# Patient Record
Sex: Female | Born: 1935 | Race: Black or African American | Hispanic: No | Smoking: Former smoker
Health system: Southern US, Community
[De-identification: ages and names within clinical notes are randomized; demographics above are authoritative.]

## PROBLEM LIST (undated history)

## (undated) DIAGNOSIS — N289 Disorder of kidney and ureter, unspecified: Secondary | ICD-10-CM

## (undated) DIAGNOSIS — F0390 Unspecified dementia without behavioral disturbance: Secondary | ICD-10-CM

## (undated) DIAGNOSIS — I480 Paroxysmal atrial fibrillation: Secondary | ICD-10-CM

## (undated) DIAGNOSIS — I219 Acute myocardial infarction, unspecified: Secondary | ICD-10-CM

## (undated) DIAGNOSIS — E785 Hyperlipidemia, unspecified: Secondary | ICD-10-CM

## (undated) DIAGNOSIS — I2510 Atherosclerotic heart disease of native coronary artery without angina pectoris: Secondary | ICD-10-CM

## (undated) DIAGNOSIS — I10 Essential (primary) hypertension: Secondary | ICD-10-CM

## (undated) DIAGNOSIS — K3184 Gastroparesis: Secondary | ICD-10-CM

## (undated) DIAGNOSIS — K219 Gastro-esophageal reflux disease without esophagitis: Secondary | ICD-10-CM

## (undated) DIAGNOSIS — Z950 Presence of cardiac pacemaker: Secondary | ICD-10-CM

## (undated) DIAGNOSIS — E039 Hypothyroidism, unspecified: Secondary | ICD-10-CM

## (undated) HISTORY — DX: Gastroparesis: K31.84

## (undated) HISTORY — PX: PACEMAKER INSERTION: SHX728

## (undated) HISTORY — DX: Paroxysmal atrial fibrillation: I48.0

## (undated) HISTORY — PX: APPENDECTOMY: SHX54

## (undated) HISTORY — DX: Unspecified dementia, unspecified severity, without behavioral disturbance, psychotic disturbance, mood disturbance, and anxiety: F03.90

## (undated) HISTORY — DX: Hypothyroidism, unspecified: E03.9

## (undated) HISTORY — DX: Presence of cardiac pacemaker: Z95.0

## (undated) HISTORY — DX: Essential (primary) hypertension: I10

## (undated) HISTORY — DX: Hyperlipidemia, unspecified: E78.5

## (undated) HISTORY — DX: Gastro-esophageal reflux disease without esophagitis: K21.9

---

## 2001-05-12 ENCOUNTER — Inpatient Hospital Stay (HOSPITAL_COMMUNITY): Admission: AD | Admit: 2001-05-12 | Discharge: 2001-05-16 | Payer: Self-pay | Admitting: Cardiology

## 2001-05-12 ENCOUNTER — Encounter: Payer: Self-pay | Admitting: Emergency Medicine

## 2001-05-29 ENCOUNTER — Emergency Department (HOSPITAL_COMMUNITY): Admission: EM | Admit: 2001-05-29 | Discharge: 2001-05-29 | Payer: Self-pay | Admitting: Emergency Medicine

## 2001-12-17 HISTORY — PX: HIP FRACTURE SURGERY: SHX118

## 2002-01-31 ENCOUNTER — Encounter: Payer: Self-pay | Admitting: Emergency Medicine

## 2002-01-31 ENCOUNTER — Emergency Department (HOSPITAL_COMMUNITY): Admission: EM | Admit: 2002-01-31 | Discharge: 2002-02-01 | Payer: Self-pay | Admitting: Emergency Medicine

## 2002-02-01 ENCOUNTER — Inpatient Hospital Stay (HOSPITAL_COMMUNITY): Admission: EM | Admit: 2002-02-01 | Discharge: 2002-02-04 | Payer: Self-pay | Admitting: Internal Medicine

## 2002-02-02 ENCOUNTER — Encounter: Payer: Self-pay | Admitting: Cardiovascular Disease

## 2002-04-22 ENCOUNTER — Encounter: Payer: Self-pay | Admitting: Emergency Medicine

## 2002-04-22 ENCOUNTER — Inpatient Hospital Stay (HOSPITAL_COMMUNITY): Admission: EM | Admit: 2002-04-22 | Discharge: 2002-04-30 | Payer: Self-pay | Admitting: Emergency Medicine

## 2002-04-25 ENCOUNTER — Encounter: Payer: Self-pay | Admitting: Internal Medicine

## 2002-04-26 ENCOUNTER — Encounter: Payer: Self-pay | Admitting: Orthopaedic Surgery

## 2002-04-30 ENCOUNTER — Inpatient Hospital Stay: Admission: AD | Admit: 2002-04-30 | Discharge: 2002-05-15 | Payer: Self-pay | Admitting: Family Medicine

## 2002-05-12 ENCOUNTER — Encounter: Payer: Self-pay | Admitting: Family Medicine

## 2002-05-12 ENCOUNTER — Ambulatory Visit (HOSPITAL_COMMUNITY): Admission: RE | Admit: 2002-05-12 | Discharge: 2002-05-12 | Payer: Self-pay | Admitting: Family Medicine

## 2002-11-30 ENCOUNTER — Encounter: Payer: Self-pay | Admitting: Emergency Medicine

## 2002-11-30 ENCOUNTER — Inpatient Hospital Stay (HOSPITAL_COMMUNITY): Admission: EM | Admit: 2002-11-30 | Discharge: 2002-12-11 | Payer: Self-pay | Admitting: Emergency Medicine

## 2002-12-01 ENCOUNTER — Encounter: Payer: Self-pay | Admitting: Cardiovascular Disease

## 2003-02-10 ENCOUNTER — Ambulatory Visit (HOSPITAL_COMMUNITY): Admission: RE | Admit: 2003-02-10 | Discharge: 2003-02-10 | Payer: Self-pay | Admitting: Family Medicine

## 2003-02-10 ENCOUNTER — Encounter: Payer: Self-pay | Admitting: Family Medicine

## 2004-08-10 ENCOUNTER — Ambulatory Visit (HOSPITAL_COMMUNITY): Admission: RE | Admit: 2004-08-10 | Discharge: 2004-08-10 | Payer: Self-pay | Admitting: Family Medicine

## 2005-08-21 ENCOUNTER — Emergency Department (HOSPITAL_COMMUNITY): Admission: EM | Admit: 2005-08-21 | Discharge: 2005-08-21 | Payer: Self-pay | Admitting: Emergency Medicine

## 2005-12-17 HISTORY — PX: HERNIA REPAIR: SHX51

## 2006-06-17 ENCOUNTER — Ambulatory Visit (HOSPITAL_COMMUNITY): Admission: RE | Admit: 2006-06-17 | Discharge: 2006-06-17 | Payer: Self-pay | Admitting: Family Medicine

## 2006-07-29 ENCOUNTER — Inpatient Hospital Stay (HOSPITAL_COMMUNITY): Admission: EM | Admit: 2006-07-29 | Discharge: 2006-08-02 | Payer: Self-pay | Admitting: Emergency Medicine

## 2006-07-29 ENCOUNTER — Encounter (INDEPENDENT_AMBULATORY_CARE_PROVIDER_SITE_OTHER): Payer: Self-pay | Admitting: *Deleted

## 2008-11-24 ENCOUNTER — Emergency Department (HOSPITAL_COMMUNITY): Admission: EM | Admit: 2008-11-24 | Discharge: 2008-11-24 | Payer: Self-pay | Admitting: Emergency Medicine

## 2009-08-30 ENCOUNTER — Inpatient Hospital Stay (HOSPITAL_COMMUNITY): Admission: EM | Admit: 2009-08-30 | Discharge: 2009-09-02 | Payer: Self-pay | Admitting: Emergency Medicine

## 2009-09-08 ENCOUNTER — Emergency Department (HOSPITAL_COMMUNITY): Admission: EM | Admit: 2009-09-08 | Discharge: 2009-09-08 | Payer: Self-pay | Admitting: Emergency Medicine

## 2009-09-09 ENCOUNTER — Inpatient Hospital Stay (HOSPITAL_COMMUNITY): Admission: EM | Admit: 2009-09-09 | Discharge: 2009-09-14 | Payer: Self-pay | Admitting: Emergency Medicine

## 2011-01-06 ENCOUNTER — Encounter: Payer: Self-pay | Admitting: Family Medicine

## 2011-01-07 ENCOUNTER — Encounter: Payer: Self-pay | Admitting: Family Medicine

## 2011-02-13 ENCOUNTER — Emergency Department (HOSPITAL_COMMUNITY): Payer: Medicare Other

## 2011-02-13 ENCOUNTER — Inpatient Hospital Stay (HOSPITAL_COMMUNITY)
Admission: EM | Admit: 2011-02-13 | Discharge: 2011-02-16 | Disposition: A | Discharge status: Other Acute Inpatient Hospital | Payer: Medicare Other | Source: Home / Self Care | Attending: Family Medicine | Admitting: Family Medicine

## 2011-02-13 DIAGNOSIS — I2789 Other specified pulmonary heart diseases: Secondary | ICD-10-CM | POA: Diagnosis present

## 2011-02-13 DIAGNOSIS — I453 Trifascicular block: Secondary | ICD-10-CM | POA: Diagnosis present

## 2011-02-13 DIAGNOSIS — I214 Non-ST elevation (NSTEMI) myocardial infarction: Secondary | ICD-10-CM | POA: Diagnosis present

## 2011-02-13 DIAGNOSIS — F0390 Unspecified dementia without behavioral disturbance: Secondary | ICD-10-CM | POA: Diagnosis present

## 2011-02-13 DIAGNOSIS — I442 Atrioventricular block, complete: Secondary | ICD-10-CM | POA: Diagnosis present

## 2011-02-13 DIAGNOSIS — I10 Essential (primary) hypertension: Secondary | ICD-10-CM | POA: Diagnosis present

## 2011-02-13 DIAGNOSIS — J9819 Other pulmonary collapse: Secondary | ICD-10-CM | POA: Diagnosis present

## 2011-02-13 DIAGNOSIS — E119 Type 2 diabetes mellitus without complications: Secondary | ICD-10-CM | POA: Diagnosis present

## 2011-02-13 DIAGNOSIS — T82190A Other mechanical complication of cardiac electrode, initial encounter: Secondary | ICD-10-CM | POA: Diagnosis present

## 2011-02-13 DIAGNOSIS — D509 Iron deficiency anemia, unspecified: Secondary | ICD-10-CM | POA: Diagnosis present

## 2011-02-13 DIAGNOSIS — J189 Pneumonia, unspecified organism: Principal | ICD-10-CM | POA: Diagnosis present

## 2011-02-13 DIAGNOSIS — Z96649 Presence of unspecified artificial hip joint: Secondary | ICD-10-CM

## 2011-02-13 DIAGNOSIS — Z45018 Encounter for adjustment and management of other part of cardiac pacemaker: Secondary | ICD-10-CM

## 2011-02-13 DIAGNOSIS — J90 Pleural effusion, not elsewhere classified: Secondary | ICD-10-CM | POA: Diagnosis present

## 2011-02-13 DIAGNOSIS — Z950 Presence of cardiac pacemaker: Secondary | ICD-10-CM

## 2011-02-13 DIAGNOSIS — Y831 Surgical operation with implant of artificial internal device as the cause of abnormal reaction of the patient, or of later complication, without mention of misadventure at the time of the procedure: Secondary | ICD-10-CM | POA: Diagnosis present

## 2011-02-13 DIAGNOSIS — Z794 Long term (current) use of insulin: Secondary | ICD-10-CM

## 2011-02-13 DIAGNOSIS — I441 Atrioventricular block, second degree: Secondary | ICD-10-CM | POA: Diagnosis present

## 2011-02-13 DIAGNOSIS — E039 Hypothyroidism, unspecified: Secondary | ICD-10-CM | POA: Diagnosis present

## 2011-02-13 DIAGNOSIS — R918 Other nonspecific abnormal finding of lung field: Secondary | ICD-10-CM | POA: Diagnosis present

## 2011-02-13 LAB — CK TOTAL AND CKMB (NOT AT ARMC)
CK, MB: 7.2 ng/mL (ref 0.3–4.0)
Relative Index: 2.7 — ABNORMAL HIGH (ref 0.0–2.5)
Total CK: 263 U/L — ABNORMAL HIGH (ref 7–177)

## 2011-02-13 LAB — CBC
Hemoglobin: 10.5 g/dL — ABNORMAL LOW (ref 12.0–15.0)
RDW: 14.6 % (ref 11.5–15.5)
WBC: 5.5 10*3/uL (ref 4.0–10.5)

## 2011-02-13 LAB — COMPREHENSIVE METABOLIC PANEL
AST: 45 U/L — ABNORMAL HIGH (ref 0–37)
Alkaline Phosphatase: 82 U/L (ref 39–117)
BUN: 19 mg/dL (ref 6–23)
CO2: 23 mEq/L (ref 19–32)
Calcium: 8.8 mg/dL (ref 8.4–10.5)
GFR calc Af Amer: >60 mL/min (ref >60)
Glucose, Bld: 97 mg/dL (ref 70–99)
Potassium: 4.7 mEq/L (ref 3.5–5.1)
Sodium: 129 mEq/L — ABNORMAL LOW (ref 135–145)

## 2011-02-13 LAB — URINALYSIS, ROUTINE W REFLEX MICROSCOPIC
Bilirubin Urine: NEGATIVE
Ketones, ur: NEGATIVE mg/dL
Leukocytes, UA: NEGATIVE

## 2011-02-13 LAB — SALICYLATE LEVEL: Salicylate Lvl: <4 mg/dL (ref 2.8–20.0)

## 2011-02-13 LAB — LIPASE, BLOOD: Lipase: 20 U/L (ref 11–59)

## 2011-02-13 LAB — DIFFERENTIAL
Basophils Absolute: 0 10*3/uL (ref 0.0–0.1)
Basophils Relative: 0 % (ref 0–1)
Lymphs Abs: 0.7 10*3/uL (ref 0.7–4.0)
Monocytes Relative: 5 % (ref 3–12)

## 2011-02-13 LAB — TROPONIN I: Troponin I: 0.27 ng/mL — ABNORMAL HIGH (ref 0.00–0.06)

## 2011-02-13 LAB — URINE MICROSCOPIC-ADD ON

## 2011-02-14 LAB — CARDIAC PANEL(CRET KIN+CKTOT+MB+TROPI)
Relative Index: 2.7 — ABNORMAL HIGH (ref 0.0–2.5)
Total CK: 254 U/L — ABNORMAL HIGH (ref 7–177)
Troponin I: 0.39 ng/mL — ABNORMAL HIGH (ref 0.00–0.06)
Troponin I: 0.3 ng/mL — ABNORMAL HIGH (ref 0.00–0.06)

## 2011-02-14 LAB — GLUCOSE, CAPILLARY
Glucose-Capillary: 123 mg/dL — ABNORMAL HIGH (ref 70–99)
Glucose-Capillary: 218 mg/dL — ABNORMAL HIGH (ref 70–99)

## 2011-02-15 ENCOUNTER — Inpatient Hospital Stay (HOSPITAL_COMMUNITY): Payer: Medicare Other

## 2011-02-15 DIAGNOSIS — I369 Nonrheumatic tricuspid valve disorder, unspecified: Secondary | ICD-10-CM

## 2011-02-15 DIAGNOSIS — R7989 Other specified abnormal findings of blood chemistry: Secondary | ICD-10-CM

## 2011-02-15 DIAGNOSIS — I442 Atrioventricular block, complete: Secondary | ICD-10-CM

## 2011-02-15 LAB — CARDIAC PANEL(CRET KIN+CKTOT+MB+TROPI)
CK, MB: 4.9 ng/mL — ABNORMAL HIGH (ref 0.3–4.0)
Relative Index: 2.1 (ref 0.0–2.5)
Relative Index: 2.3 (ref 0.0–2.5)
Total CK: 267 U/L — ABNORMAL HIGH (ref 7–177)
Troponin I: 0.37 ng/mL — ABNORMAL HIGH (ref 0.00–0.06)
Troponin I: 0.3 ng/mL — ABNORMAL HIGH (ref 0.00–0.06)

## 2011-02-15 LAB — GLUCOSE, CAPILLARY
Glucose-Capillary: 200 mg/dL — ABNORMAL HIGH (ref 70–99)
Glucose-Capillary: 196 mg/dL — ABNORMAL HIGH (ref 70–99)
Glucose-Capillary: 192 mg/dL — ABNORMAL HIGH (ref 70–99)

## 2011-02-15 LAB — BASIC METABOLIC PANEL
BUN: 17 mg/dL (ref 6–23)
Chloride: 98 mEq/L (ref 96–112)
Creatinine, Ser: 1.28 mg/dL — ABNORMAL HIGH (ref 0.4–1.2)
GFR calc Af Amer: 48 mL/min — ABNORMAL LOW (ref >60)
GFR calc non Af Amer: 40 mL/min — ABNORMAL LOW (ref >60)
Potassium: 4.4 mEq/L (ref 3.5–5.1)

## 2011-02-15 NOTE — Consult Note (Signed)
[name], [name]            ACCOUNT NO.:  [id number]  MEDICAL RECORD NO.:  [id number]           PATIENT TYPE:  I  LOCATION:  IC03                          FACILITY:  [facility]  PHYSICIAN:  Jonelle Sidle, MD DATE OF BIRTH:  Dec 14, 1925  DATE OF CONSULTATION: DATE OF DISCHARGE:                                CONSULTATION   ADDENDUM:  REQUESTING PHYSICIAN:  Melvyn Novas, MD  PRIOR CARDIOLOGIST: 1. Thomas C. Wall, MD, FACC 2. Bruce Elvera Lennox Juanda Chance, MD, [facility]  REASON FOR CONSULTATION:  Bradycardia.  Please refer to the full dictated cardiology consultation by Ms. Joni Reining, NP.  In brief summary, Ms. 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 Juanda Chance in December 2003.  Details of her cardiology and device followup are not clear to me at this point based on record review.  She presently resides in an assisted living facility and was admitted to the hospital with symptoms of cough, chest congestion, possibly also some chest pain.  She is being managed for possible pneumonia with antibiotic therapy.  Review of her laboratory data finds abnormal cardiac markers with troponin-I levels hovering around 0.30, and CK-MB levels peaking at 7.2, trending down from there. ECG shows nonspecific ST-T changes with left anterior fascicular block, right bundle-branch block, and evidence of intermittent second-degree type 1 and type 2 heart block, most importantly the absence of any pacer spikes.  I became involved in her care after reading a 2-D echocardiogram earlier today that demonstrated evidence of severe left ventricular hypertrophy, likely a hypertrophic cardiomyopathy, with LVEF of 65% to 70%, moderate mitral regurgitation, moderately dilated  left atrium, and moderate tricuspid regurgitation with evidence of severe pulmonary hypertension, pulmonary artery systolic pressure of approximately at 70 mmHg.  She was noted on that particular study to have evidence of heart block that appeared to be intermittently high- grade with heart rates in the 30s to 40s, despite the presence of a pacemaker wire visualized within the right heart.  I called Dr. Janna Arch about these findings, and we arranged to have the patient's device interrogated, finding that the battery is completely discharged.  At this point, no family is available for further discussion about the patient.  She apparently has a healthcare power of attorney locally, and a sister that has not been able to be reached.  She is hemodynamically stable with heart rate typically in the 40s.  In speaking with her, she is alert and oriented x1, not complaining of active chest pain, and with interaction, her heart rate comes up into the 40s and 50s, sinus rhythm with a very prolonged PR interval noted.  She continues to have intermittent second-degree heart block, and I suspect higher degree of heart block at times.  At presentation, she was on beta- blocker therapy, specifically Lopressor at 50 mg p.o. b.i.d.  This has recently been stopped.  PHYSICAL EXAMINATION:  VITAL SIGNS:  On my examination, temperature is 98.2 degrees, heart rate in the 40s,  dipping down into the upper 30s intermittently, respirations 22, oxygen saturation is 93% on room air, blood pressure 126/64, weight is 83 kg. GENERAL:  This is an obese woman in no acute distress. HEENT:  Conjunctiva and lids are normal.  Oropharynx with moist mucosa. NECK:  Supple.  Increased girth.  No elevated JVP.  No thyromegaly. LUNGS:  Exhibit diminished coarse breath sounds. CARDIAC:  Exam reveals an S4 with soft systolic murmur.  No S3, gallop, or pericardial rub.  Examination of the thorax finds a pacemaker pocket site with  device on the right. ABDOMEN:  Obese, nontender.  Bowel sounds present. EXTREMITIES:  Exhibit no significant pitting edema.  Distal pulses are 1- 2+. SKIN:  Warm and dry. MUSCULOSKELETAL:  No kyphosis is noted. NEUROPSYCHIATRIC:  The patient is alert and oriented x1 at this time. She is calm with appropriate affect.  LABORATORY DATA:  WBC is 5.5, hemoglobin 10.5, platelets 251.  Sodium 135, potassium 4.4, chloride 98, bicarb 26, glucose 161, BUN 17, creatinine 1.2.  Peak CK is 267, peak CK-MB initially was 7.2 now down to 4.9, and troponin-I has been relatively flat around 0.30.  DIAGNOSTIC STUDIES:  Most recent chest x-ray from February 13, 2011 reported low lung volumes with vascular crowning, bibasilar atelectasis, also stable cardiac enlargement.  REVIEW OF SYSTEMS:  Somewhat difficult given the patient's baseline mental status and reported dementia.  She does state that she has trouble "choking" on some foods, question of aspiration events.  Her chest pain episodes are difficult to sort out, somewhat vague in description.  She has not had any obvious syncope.  IMPRESSION: 1. Trifascicular heart block with second-degree type 1 and type 2 AV     block, likely episodes of higher degree heart block as well, with     findings of a completely discharged pacemaker battery.  Based on     record review, she had a Medtronic Kappa device placement Dr.     Juanda Chance in December 2003.  Details of followup are not clear to me     at this time.  She is hemodynamically stable in terms of blood     pressure and has had beta-blocker therapy interrupted. 2. Abnormal cardiac markers, technically a probable type 2 non-ST-     elevation myocardial infarction, recent chest pain somewhat vague     and difficult to sort out.  ECG does not show acute ST-segment     changes over nonspecific abnormalities, which have included some     inferior T-wave inversions on serial tracings.  There is no clear      history of obstructive coronary artery disease based on limited     information, which includes a normal Myoview from 2003. 3. Probable hypertrophic or hypertensive cardiomyopathy with severe     left ventricular hypertrophy and LVEF recently documented at 65% to     70% associated with diastolic dysfunction. 4. Severe pulmonary hypertension with pulmonary artery systolic     pressure of 70 mmHg, associated with moderate mitral and tricuspid     regurgitation. 5. Reported dementia. 6. Type 2 diabetes mellitus. 7. Hypertension. 8. Hyperlipidemia. 9. Possible pneumonia, question additionally the possibility of     intermittent aspiration.  RECOMMENDATIONS:  Agree with discontinuing beta-blocker.  We can certainly consider having the patient transferred to [facility] for evaluation by our Electrophysiology Team, specifically for battery replacement in the patient's Medtronic device.  At this point, she is hemodynamically stable, and attempts are being made by  nursing staff to locate family and/or power of attorney to discuss this issue further. Once appropriate contacts were made, our service can be updated and we can assist with plans as needed.  In terms of her cardiac markers, this will need to be followed, although at this point, I do not anticipate aggressive ischemic evaluation, and the troponin levels may in fact be more consistent with her hypertrophic myopathy with increased left ventricular strain in the setting of bradycardia and hypoertension, as well as her pulmonary hypertension.  For now, she is on aspirin and Lovenox, continuing on Lotensin and Norvasc for blood pressure control.  Our service will follow with you.     Jonelle Sidle, MD     SGM/MEDQ  D:  02/15/2011  T:  02/15/2011  Job:  [id number]  Electronically Signed by Nona Dell MD on 02/15/2011 09:34:46 PM

## 2011-02-16 ENCOUNTER — Inpatient Hospital Stay (HOSPITAL_COMMUNITY)
Admission: AD | Admit: 2011-02-16 | Discharge: 2011-02-19 | DRG: 982 | Disposition: A | Discharge status: Home Health | Payer: Medicare Other | Source: Other Acute Inpatient Hospital | Attending: Internal Medicine | Admitting: Internal Medicine

## 2011-02-16 DIAGNOSIS — I5031 Acute diastolic (congestive) heart failure: Secondary | ICD-10-CM

## 2011-02-16 DIAGNOSIS — I441 Atrioventricular block, second degree: Secondary | ICD-10-CM

## 2011-02-16 LAB — CBC
HCT: 32.2 % — ABNORMAL LOW (ref 36.0–46.0)
MCH: 29.8 pg (ref 26.0–34.0)
MCV: 90.4 fL (ref 78.0–100.0)
Platelets: 241 10*3/uL (ref 150–400)
RDW: 14.4 % (ref 11.5–15.5)

## 2011-02-16 LAB — BASIC METABOLIC PANEL
BUN: 14 mg/dL (ref 6–23)
Creatinine, Ser: 1.18 mg/dL (ref 0.4–1.2)
GFR calc non Af Amer: 44 mL/min — ABNORMAL LOW (ref >60)
Glucose, Bld: 239 mg/dL — ABNORMAL HIGH (ref 70–99)

## 2011-02-16 LAB — GLUCOSE, CAPILLARY
Glucose-Capillary: 203 mg/dL — ABNORMAL HIGH (ref 70–99)
Glucose-Capillary: 214 mg/dL — ABNORMAL HIGH (ref 70–99)
Glucose-Capillary: 123 mg/dL — ABNORMAL HIGH (ref 70–99)

## 2011-02-16 LAB — DIFFERENTIAL
Eosinophils Absolute: 0.2 10*3/uL (ref 0.0–0.7)
Eosinophils Relative: 5 % (ref 0–5)
Lymphocytes Relative: 34 % (ref 12–46)
Neutro Abs: 2 10*3/uL (ref 1.7–7.7)
Neutrophils Relative %: 51 % (ref 43–77)

## 2011-02-16 LAB — IRON AND TIBC
Saturation Ratios: 17 % — ABNORMAL LOW (ref 20–55)
UIBC: 231 ug/dL

## 2011-02-16 LAB — BRAIN NATRIURETIC PEPTIDE: Pro B Natriuretic peptide (BNP): 611 pg/mL — ABNORMAL HIGH (ref 0.0–100.0)

## 2011-02-16 LAB — CARDIAC PANEL(CRET KIN+CKTOT+MB+TROPI): Troponin I: 0.19 ng/mL — ABNORMAL HIGH (ref 0.00–0.06)

## 2011-02-17 ENCOUNTER — Inpatient Hospital Stay (HOSPITAL_COMMUNITY): Payer: Medicare Other

## 2011-02-17 LAB — BASIC METABOLIC PANEL
CO2: 27 mEq/L (ref 19–32)
Calcium: 8.8 mg/dL (ref 8.4–10.5)
Creatinine, Ser: 1.05 mg/dL (ref 0.4–1.2)
GFR calc Af Amer: >60 mL/min (ref >60)
GFR calc non Af Amer: 50 mL/min — ABNORMAL LOW (ref >60)
Sodium: 135 mEq/L (ref 135–145)

## 2011-02-17 LAB — BLOOD GAS, ARTERIAL
Acid-Base Excess: 5.7 mmol/L — ABNORMAL HIGH (ref 0.0–2.0)
Bicarbonate: 29.6 mEq/L — ABNORMAL HIGH (ref 20.0–24.0)
Drawn by: 213381
FIO2: 0.21 %
O2 Saturation: 87 %
Patient temperature: 98.6
TCO2: 30.9 mmol/L (ref 0–100)
pCO2 arterial: 42.3 mmHg (ref 35.0–45.0)
pH, Arterial: 7.46 — ABNORMAL HIGH (ref 7.350–7.400)
pO2, Arterial: 52 mmHg — ABNORMAL LOW (ref 80.0–100.0)

## 2011-02-17 LAB — AMMONIA: Ammonia: 37 umol/L — ABNORMAL HIGH (ref 11–35)

## 2011-02-17 LAB — CBC
Hemoglobin: 11 g/dL — ABNORMAL LOW (ref 12.0–15.0)
MCH: 29.9 pg (ref 26.0–34.0)
Platelets: 273 10*3/uL (ref 150–400)
RBC: 3.68 MIL/uL — ABNORMAL LOW (ref 3.87–5.11)
WBC: 4.1 10*3/uL (ref 4.0–10.5)

## 2011-02-17 LAB — GLUCOSE, CAPILLARY
Glucose-Capillary: 228 mg/dL — ABNORMAL HIGH (ref 70–99)
Glucose-Capillary: 287 mg/dL — ABNORMAL HIGH (ref 70–99)
Glucose-Capillary: 268 mg/dL — ABNORMAL HIGH (ref 70–99)

## 2011-02-17 LAB — APTT: aPTT: 30 seconds (ref 24–37)

## 2011-02-18 LAB — BASIC METABOLIC PANEL
GFR calc non Af Amer: 41 mL/min — ABNORMAL LOW (ref >60)
Glucose, Bld: 185 mg/dL — ABNORMAL HIGH (ref 70–99)
Potassium: 4.1 mEq/L (ref 3.5–5.1)
Sodium: 134 mEq/L — ABNORMAL LOW (ref 135–145)

## 2011-02-18 LAB — GLUCOSE, CAPILLARY
Glucose-Capillary: 279 mg/dL — ABNORMAL HIGH (ref 70–99)
Glucose-Capillary: 248 mg/dL — ABNORMAL HIGH (ref 70–99)

## 2011-02-19 LAB — BASIC METABOLIC PANEL WITH GFR
BUN: 11 mg/dL (ref 6–23)
CO2: 29 meq/L (ref 19–32)
Calcium: 8.7 mg/dL (ref 8.4–10.5)
Chloride: 96 meq/L (ref 96–112)
Creatinine, Ser: 1.14 mg/dL (ref 0.4–1.2)
GFR calc non Af Amer: 46 mL/min — ABNORMAL LOW
Glucose, Bld: 229 mg/dL — ABNORMAL HIGH (ref 70–99)
Potassium: 3.9 meq/L (ref 3.5–5.1)
Sodium: 132 meq/L — ABNORMAL LOW (ref 135–145)

## 2011-02-19 LAB — GLUCOSE, CAPILLARY
Glucose-Capillary: 222 mg/dL — ABNORMAL HIGH (ref 70–99)
Glucose-Capillary: 254 mg/dL — ABNORMAL HIGH (ref 70–99)

## 2011-02-19 NOTE — Consult Note (Signed)
[name], [name]            ACCOUNT NO.:  [id number]  MEDICAL RECORD NO.:  [id number]           PATIENT TYPE:  I  LOCATION:  IC03                          FACILITY:  [facility]  PHYSICIAN:  Jonelle Sidle, MD DATE OF BIRTH:  08-12-1925  DATE OF CONSULTATION:  02/15/2011 DATE OF DISCHARGE:                                CONSULTATION   PRIMARY CARDIOLOGIST:  Dr. Doylene Canning. Ladona Ridgel.  PRIMARY CARE PHYSICIAN:  Dr. Janna Arch.  REASON FOR CONSULTATION:  Complete heart block with positive cardiac enzymes.  HISTORY OF PRESENT ILLNESS:  This is an 77 year old African American female with known history of bifascicular block, status post pacemaker in 2003 per Dr. Ladona Ridgel with a known history of hypertension as well and diabetes and hyperlipidemia.  The patient was admitted initially with cough and congestion, had an abnormal chest x-ray demonstrating infiltrates.  She was also noted to have positive cardiac enzymes onadmission.  Echocardiogram was ordered by Dr. Janna Arch and which was found to have incidentally that she was in complete heart block with intermittent second-degree heart block type 1 and 2.  Dr. Diona Browner did call Dr. Janna Arch to notify him of this and we were asked to some consult as we have seen the patient in the remote past for pacemaker implantation.  The pacemaker was interrogated per Medtronic and it was found that the battery was totally depleted and no interrogation with possible.  The patient is currently very stable.  She is responsive. She has some mild dementia and therefore is partially confused, but she denies any symptoms.  We have tried to call family members to question whether they would wishes [place] to proceed with replacement of pacemaker battery versus allowing her to continue as is.  After several attempt, we have not been unable to contact them.  REVIEW OF SYSTEMS:  Positive for cough and dysphasia.  She states that her food sticks going down and she  often cough and gets them caught in her throat.  All other systems have been reviewed and found to be negative.  Code status is full.  Please note that the patient is a poor historian secondary to dementia.  PAST MEDICAL HISTORY:  Insulin-dependent diabetes, hypertension, hyperlipidemia, dual-chamber pacemaker, since then Medtronic cath secondary to tri-fascicular block.  This is a dual chamber placed in 2003 per Dr. Ladona Ridgel.  She also has a history of paroxysmal atrial fibrillation, GERD, and dementia.  The patient did have an ischemic workup revealing a Cardiolite in 2002 showing no ischemia.  Echocardiogram dated February 15, 2011 revealing left ventricular cavity size was reduced with wall thickness increase with severe LVH, systolic function with vigorous with a range of 65% EF.  She also had elevated end diastolic filling pressures.  The patient had mild to moderately calcified aortic valve, mitral valve had calcified annulus with mildly thickened leaflets and moderate regurgitation.  Pulmonary artery pressure was severely increase with an estimated pressure of 70 mmHg.  PAST SURGICAL HISTORY:  Left hip replacement also incarcerated hernia repair and dual-chamber pacemaker placement.  SOCIAL HISTORY:  She lives in [place] [facility]. She is retired from [employer].  She is widowed.  She has family members, a sister but we are unable to contact her.  She stopped smoking several years ago.  Negative EtOH or drug use.  FAMILY HISTORY:  Mother deceased from an MI in her 70s.  No other family history is available.  CURRENT MEDICATIONS:  Prior to admission: 1. Norvasc 10 mg daily. 2. Insulin 70/30, 12 units q.a.m. 3. Lopressor 50 mg b.i.d. 4. Benazepril 10 mg daily. 5. Synthroid 50 mg daily. 6. Allopurinol 20 mg daily. 7. Prilosec 20 mg daily.  ALLERGIES:  No known drug allergies.  CURRENT LABORATORY DATA:  Sodium 135, potassium 4.4, chloride 98, CO2  of 26, BUN 17, creatinine 1.2, glucose 161.  Hemoglobin 10.5, hematocrit 31.3, white blood cells 5.5, platelets 251.  Troponin 0.27, 0.39, 0.30, and 0.37 respectively with MBs between 5.2 and 5.9 respectively.  MRSA is negative.  RADIOLOGY:  Chest x-ray low lung volumes with vascular crowding bibasilar atelectasis.  EKG revealing third-degree AV block with intermittent Wenckebach with rates in the 30s to 40s.  PHYSICAL EXAMINATION:  VITAL SIGNS:  Blood pressure 156/108, pulse 30, respirations 22, temperature 100% on 2 L. GENERAL:  She is awake and alert with some mild confusion although she is responsive.  She does know the year and she knows where she is. HEENT:  Normocephalic and atraumatic.  Left eye did not react to light and appears to be deviating to the right. NECK:  Supple without bruit or JVD noted. CARDIOVASCULAR:  Irregular rhythm, bradycardic with 2/6 systolic murmur at the apex.  Pulses are 2+ and equal. LUNGS:  Some coarse breath sounds without wheezes or rhonchi.  She does not cough with deep inspiration. ABDOMEN:  Soft, obese, nontender with 2+ bowel sounds. EXTREMITIES:  Without clubbing, cyanosis, or edema. MUSCULOSKELETAL:  She does have some mild kyphosis noted. NEURO:  Cranial nerves II through XII are essentially intact.  As stated above, she is mildly confused.  IMPRESSION: 1. Complete heart block with intermittent Wenckebach.  Pacemaker     battery is completed depleted, but heart rate does go up from 30s     to 60s with movement and stimulation pattern of third-degree to     second-degree variable heart block is noted.  Lopressor has been     discontinued and we agree with this.  We have external pacemaker     made available, should her heart rate be less than or equal to 29     beats per minute sustain symptomatically with atropine p.r.n.  We     will need definite decision concerning better replacement if her     family request.  She is certainly still  viable and this would     definitely make her feel better, should her heart rhythm and rate     be more normalized.  She is stable currently.  We will make further     recommendations once we have family's opinion and/or request     concerning need for replacement or to leave her alone. 2. Hypertension, currently is not well controlled.  We can increase     benazepril as needed, but now we will discontinue the clonidine as     this is not the best choice at this time secondary to bradycardia.     We will change to hydralazine 25 mg t.i.d. and leave room for     titration should this be necessary.  We would like to thank Dr. Janna Arch for allowing [place] to participate  in the care of this patient.  Please see Dr. Remi Deter Autry Droege's addendum to this H and P for more thought and recommendations.     Bettey Mare. Lyman Bishop, NP   ______________________________ Jonelle Sidle, MD    KML/MEDQ  D:  02/15/2011  T:  02/15/2011  Job:  [id number]  Electronically Signed by Joni Reining NP on 02/19/2011 11:14:40 AM Electronically Signed by Nona Dell MD on 02/19/2011 11:31:44 AM

## 2011-02-20 ENCOUNTER — Encounter: Payer: Self-pay | Admitting: Internal Medicine

## 2011-02-22 NOTE — H&P (Signed)
[name], [name]            ACCOUNT NO.:  [id number]  MEDICAL RECORD NO.:  [id number]           PATIENT TYPE:  I  LOCATION:  A341                          FACILITY:  [facility]  PHYSICIAN:  Melvyn Novas, MDDATE OF BIRTH:  [birth year]  DATE OF ADMISSION:  02/13/2011 DATE OF DISCHARGE:  LH                             HISTORY & PHYSICAL   The patient is an 77 year old demented black female in assisted living facility with history of insulin-dependent diabetes, hypertension, hyperlipidemia and hyperthyroidism.  Apparently, the patient came in last night with an infiltrate on chest x-ray, some cough, was placed on Rocephin and Zithromax.  She seems to be significantly hypertensive at present.  I am not sure if she was compliant with medicines as an outpatient.  Cardiac enzymes are essentially elevated with a total CK minimally elevated to 54 and MB elevated to 6.9 with an elevated index of 2.7 and troponin of 0.39.  She likewise has hyponatremia.  The patient denies any specific history of angina, orthopnea or PND.  She states she does have a cough, but no sputum production.  She is a poor historian.  The patient will be admitted for dual antibiotics, Rocephin and Zithromax as well as serial cardiac enzymes, 2-D echocardiogram, anticoagulation, antiplatelet therapy and beta blockade as well as control of antihypertensive with presumably her regular medicines to see her response.  PAST MEDICAL HISTORY:  Significant for insulin-dependent diabetes, hypertension, hypothyroidism, GERD and dementia.  PAST SURGICAL HISTORY:  Remarkable for dual-chamber pacer and appendectomy.  CURRENT MEDICATIONS: 1. Norvasc 10 mg per day. 2. Insulin 70/30 insulin 12 units subcu in a.m. only or a.c. b.i.d.     not certain. 3. Lopressor 50 mg p.o. b.i.d. 4. Benazepril 10 mg p.o. daily. 5. Synthroid 50 mcg per day. 6. Allopurinol 200 mg per day. 7. Prilosec 20 mg p.o. daily.  PHYSICAL  EXAMINATION:  VITAL SIGNS:  Blood pressure at present is 197/74, temperature is 98.1, pulse is 58 and regular, respiratory rate is 20, O2 sat is 91% on room air. EYES:  EYES:  PERRLA intact.  Sclerae clear.  Conjunctivae pink. NECK:  JVD.  No carotid bruits.  No thyromegaly or thyroid bruits. LUNGS:  Prolonged expiratory phase.  Diminished breath sounds at bases. No rales, wheeze or rhonchi appreciable. HEART:  Regular rhythm, 1/6 aortic outflow murmur.  No S3, S4, gallops. No heaves, thrills, or rubs. ABDOMEN:  Soft, nontender.  Bowel sounds are normoactive.  No guarding, rebound, or hepatosplenomegaly. EXTREMITIES:  Trace to 1+ pedal edema. NEUROLOGIC:  Cranial nerves II through XII gross intact.  The patient moves all four extremities.  The patient is mildly demented, oriented to person and place.  IMPRESSION: 1. Infiltrate per chest x-ray on dual antibiotics. 2. Elevated cardiac enzymes, consider ischemic event. 3. Hypertension. 4. Insulin-dependent diabetes. 5. Hyperlipidemia. 6. Dementia.  PLAN:  At present is aspirin 325 p.o. daily, continue Norvasc 10, add Lopressor 25 p.o. b.i.d. before meals and at bedtime glucoses, serial cardiac enzymes q.8 h x3, 2-D echocardiogram for wall motion analysis, Lovenox 40 per day subcu, and I will make further recommendations as the database expands.  Melvyn Novas, MD     RMD/MEDQ  D:  02/14/2011  T:  02/14/2011  Job:  [id number]  Electronically Signed by Oval Linsey MD on 02/22/2011 07:15:43 AM

## 2011-02-25 NOTE — Consult Note (Signed)
NAMEMAREN, WIESEN NO.:  [id number]  MEDICAL RECORD NO.:  [id number]           PATIENT TYPE:  I  LOCATION:  2032                         FACILITY:  [facility]  PHYSICIAN:  Thana Farr, MD    DATE OF BIRTH:  1925/03/16  DATE OF CONSULTATION:  02/18/2011 DATE OF DISCHARGE:                                CONSULTATION   CONSULT CALLED BY:  Hillis Range, MD  HISTORY OF PRESENT ILLNESS:  Ms. Gatling is an 77 year old female that was admitted due to her battery being fully discharged on her pacer. The patient was noted to be bradycardic in the 30s-40s.  The patient underwent a pacemaker generator change-out on February 16, 2011.  Since that time, per notes in the chart, the patient has been lethargic.  Consult called to determine etiology of lethargy.  The patient does have a history of dementia.  PAST MEDICAL HISTORY: 1. Diabetes. 2. Hypertension. 3. Hypothyroidism. 4. Dementia. 5. GE reflux disease.  SOCIAL HISTORY:  The patient lives in an assisted living facility. There is no current history of alcohol, tobacco, or illicit drug abuse.  MEDICATIONS:  Allopurinol, amlodipine, Lotensin, Fergon, Lasix, Apresoline. NovoLog, Levaquin, levothyroxine, Protonix, and Crestor.  PHYSICAL EXAMINATION:  Blood pressure 151/56, heart rate 73, respiratory rate 18, and T-max 99.0.  On mental status testing, the patient is alert.  She is oriented to name and place, but not to time.  She can follow simple commands without difficulty, but has more trouble with 3- step commands.  Speech is fluent.  On cranial nerve testing, II, visual fields are grossly intact.  III, IV, and VI, extraocular movements are intact.  V and VII, smile symmetric.  VII, grossly intact.  IX and X, to have positive gag.  XI, bilateral shoulder shrug.  XII, midline tongue extension.  On motor exam, the patient gives 5/5 strength throughout that is symmetric.  On sensory testing, pinprick and light touch  are intact bilaterally.  Deep tendon reflexes are 1+ in the upper extremities and absent in the lower extremities.  Plantars are downgoing on the left and upgoing on the right.  On cerebellar testing, finger-to- nose is intact bilaterally.  The patient is unable to perform heel-to- shin secondary to inability to follow commands.  LABORATORY DATA:  TSH is 3.04.  B12 of 893, ferritin 32.  Ammonia 37, calcium 8.8.  Sodium was 134, potassium 4.1, chloride 96, CO2 of 26, BUN creatinine 9 and 1.25 respectively, and glucose 185.  Hemoglobin and hematocrit 11.0 and 33.1 respectively, platelet count 273, and white blood cell count 4.1.  ASSESSMENT:  Ms. Dunsmore is an 77 year old female with a history of dementia that had some lethargy after her pacemaker generator was changed out.  She may have had a prolonged period of recuperation from this secondary to her baseline dementia.  She seems quite alert right now.  Her workup to date has been unremarkable.  We would only entertain further workup if she has some recurrent lethargy.  PLAN:  No further workup unless the patient has recurrent lethargy and at that time we would recommend head CT without contrast and EEG to  be performed.          ______________________________ Thana Farr, MD     LR/MEDQ  D:  02/18/2011  T:  02/19/2011  Job:  [id number]  Electronically Signed by Thana Farr MD on 02/25/2011 11:07:14 PM

## 2011-02-26 ENCOUNTER — Encounter (INDEPENDENT_AMBULATORY_CARE_PROVIDER_SITE_OTHER): Payer: Medicare Other

## 2011-02-26 ENCOUNTER — Encounter: Payer: Self-pay | Admitting: Internal Medicine

## 2011-02-26 DIAGNOSIS — I442 Atrioventricular block, complete: Secondary | ICD-10-CM

## 2011-02-27 NOTE — Miscellaneous (Signed)
  Clinical Lists Changes  Observations: Added new observation of PPM INDICATN: CHB (02/20/2011 7:21) Added new observation of MAGNET RTE: BOL 85 ERI 65 (02/20/2011 7:21) Added new observation of PPMLEADSTAT2: active (02/20/2011 7:21) Added new observation of PPMLEADSER2: [id number] (02/20/2011 7:21) Added new observation of PPMLEADMOD2: 5076  (02/20/2011 7:21) Added new observation of PPMLEADLOC2: RV  (02/20/2011 7:21) Added new observation of PPMLEADSTAT1: active  (02/20/2011 7:21) Added new observation of PPMLEADSER1: [id number]  (02/20/2011 7:21) Added new observation of PPMLEADMOD1: 5076  (02/20/2011 7:21) Added new observation of PPMLEADLOC1: RA  (02/20/2011 7:21) Added new observation of PPM IMP MD: Hillis Range, MD  (02/20/2011 7:21) Added new observation of PPMLEADDOI2: 02/16/2011  (02/20/2011 7:21) Added new observation of PPMLEADDOI1: 02/16/2011  (02/20/2011 7:21) Added new observation of PPM DOI: 02/16/2011  (02/20/2011 7:21) Added new observation of PPM SERL#: [id number]  (02/20/2011 7:21) Added new observation of PPM MODL#: [id number]  (02/20/2011 0:86) Added new observation of PACEMAKERMFG: Medtronic  (02/20/2011 7:21) Added new observation of PACEMAKER MD: Lewayne Bunting, MD  (02/20/2011 7:21)      PPM Specifications Following MD:  Lewayne Bunting, MD     PPM Vendor:  Medtronic     PPM Model Number:  [id number]     PPM Serial Number:  [id number] PPM DOI:  02/16/2011     PPM Implanting MD:  Hillis Range, MD  Lead 1    Location: RA     DOI: 02/16/2011     Model #: [id number]     Serial #: [id number]     Status: active Lead 2    Location: RV     DOI: 02/16/2011     Model #: [id number]     Serial #: [id number]     Status: active  Magnet Response Rate:  BOL 85 ERI 65  Indications:  CHB

## 2011-03-02 NOTE — Op Note (Signed)
NAMEMANDY, [name]            ACCOUNT NO.:  [id number]  MEDICAL RECORD NO.:  [id number]           PATIENT TYPE:  I  LOCATION:  2032                         FACILITY:  [facility]  PHYSICIAN:  Hillis Range, MD       DATE OF BIRTH:  [birth year]  DATE OF PROCEDURE: DATE OF DISCHARGE:                              OPERATIVE REPORT   EP PROCEDURE NOTE  SURGEON:  Hillis Range, MD  PREPROCEDURE DIAGNOSES: 1. Mobitz II second-degree AV block. 2. Previously implanted pacemaker at end of life.  POSTPROCEDURE DIAGNOSES: 1. Mobitz II second-degree AV block. 2. Previously implanted pacemaker at end of life.  PROCEDURES:  Pacemaker pulse generator replacement with pocket revision.  INTRODUCTION:  [name] is a pleasant 77 year old female who presents for urgent pacemaker pulse generator replacement.  She initially presented in 2003 with Mobitz II second-degree AV block for which she underwent implantation of a Medtronic pacemaker at that time.  The patient did well without Cardiology followup.  She was admitted recently to [facility] for presumed pneumonia.  She was found to have Mobitz II second-degree AV block with intermittent episodes of complete heart block.  Attempts to interrogate her pacemaker revealed that it was at end of life.  She therefore presents for urgent pacemaker pulse generator replacement.  DESCRIPTION OF PROCEDURE:  Informed written consent was obtained and the patient was brought to the Electrophysiology Lab in the fasting state. She did not receive sedation for the procedure today.  Her right chest was prepped and draped in the usual sterile fashion by the EP lab staff. The skin overlying her existing pacemaker was infiltrated with lidocaine for local analgesia.  A 4-cm incision was made over the existing pacemaker.  The pacemaker was exposed using a combination of sharp and blunt dissection.  Electrocautery was required to assure hemostasis. There was  no foreign matter or debris within the pocket.  The pacemaker was removed from the pocket and disconnected from the leads.  The right atrial lead was confirmed to be a Medtronic model 5076- 45 (serial number [id number]) lead implanted in December 08, 2002.  The right ventricular lead was confirmed to be a Medtronic model [id number] (serial number [id number]) lead implanted in December 08, 2002.  Both leads were examined and their integrity remained intact.  Atrial lead P-waves measured 4 mV with an impedance of 381 ohms and a threshold of 1 volt at 0.5 milliseconds.  Right ventricular lead R-waves measured 3.4 mV with an impedance of 360 ohms and a threshold of 0.6 volts at 0.5 milliseconds.  Both leads were therefore connected to a Medtronic Adaptic L model ADDRL1 (serial number [id number]) pacemaker.  The pocket was then revised to accommodate this new device.  The pacemaker was then placed into the pocket.  The pocket was then irrigated with copious gentamicin solution.  The pocket was then closed in two layers with 2-0 Vicryl suture for the subcutaneous and subcuticular layers.  Steri- Strips and a sterile dressing were then applied.  There were no early apparent complications.  CONCLUSIONS: 1. Successful pacemaker pulse generator replacement for end of life,  battery status with Mobitz II second-degree AV block. 2. No early apparent complications.     Hillis Range, MD     JA/MEDQ  D:  02/16/2011  T:  02/17/2011  Job:  [id number]  cc:   Jonelle Sidle, MD  Electronically Signed by Hillis Range MD on 03/02/2011 10:53:36 PM

## 2011-03-02 NOTE — Discharge Summary (Signed)
[name], [name]            ACCOUNT NO.:  [id number]  MEDICAL RECORD NO.:  [id number]           PATIENT TYPE:  I  LOCATION:  2032                         FACILITY:  [facility]  PHYSICIAN:  Hillis Range, MD       DATE OF BIRTH:  1925-03-05  DATE OF ADMISSION:  02/16/2011 DATE OF DISCHARGE:  02/19/2011                              DISCHARGE SUMMARY   PRIMARY CARE PHYSICIAN:  Melvyn Novas, MD  CARDIOLOGIST:  Doylene Canning. Ladona Ridgel, MD  PRIMARY DIAGNOSIS:  Complete heart block.  SECONDARY DIAGNOSES: 1. Insulin-dependent diabetes. 2. Hypertension. 3. Hyperlipidemia. 4. Paroxysmal atrial fibrillation. 5. Gastroesophageal reflux disease. 6. Dementia. 7. Status post pacemaker implant in December 2003 with no outpatient     followup.  ALLERGIES:  The patient has no known drug allergies.  PROCEDURE THIS ADMISSION: 1. Pacemaker generator change on February 16, 2011 by Dr. Johney Frame.  The     patient received a Medtronic model number ADDRL #1 pacemaker.  The     previously used right atrial and right ventricular leads were used.     The patient has a 5076 right atrial and right ventricular lead. 2. CT scan of the chest on February 17, 2011, demonstrated small-to-     moderate right pleural effusion with dependent right base     atelectasis.  BRIEF HISTORY OF PRESENT ILLNESS:  [name] is an 77 year old female who initially presented in 2003 with Mobitz II second-degree heart block.  She underwent permanent pacemaker implantation at that time. She had no outpatient followup.  She was admitted recently to [facility] for presumed pneumonia.  She was found to have Mobitz II second- degree AV block with intermittent episodes of complete heart block on telemetry.  Her device was attempted to be interrogated, but found that it was end-of-life.  She was therefore referred to [facility] for urgent pacemaker pulse generator replacement.  HOSPITAL COURSE:  The patient was  admitted on February 16, 2011 for planned generator change of her previously implanted Medtronic pacemaker.  This was carried out by Dr. Johney Frame with details outlined above.  She was monitored on telemetry, which demonstrated AV pacing.  The patient did have large hematoma postop that was compressed with pressure dressing. She was also lethargic, post device generator change.  Because of her lethargy, Neurology evaluated the patient.  No further workup was recommended unless the patient had recurrent symptoms.  With recurrent symptoms, recommendations were for head CT without contrast and EEG. The patient's hematoma resolved with pressure dressing.  She was evaluated by physical therapy who felt that she was stable to return to previous living conditions.  Dr. Johney Frame examined the patient on February 19, 2011, and considered stable for discharge.  FOLLOWUP APPOINTMENTS: 1. [facility] in the [place] office on February 27, 2011 at 3:40 p.m. 2. Dr. Ladona Ridgel in the [facility] office on May 22, 2011 at  10:15 a.m.. 3. Dr. Janna Arch as scheduled.  DISCHARGE MEDICATIONS: 1. Atorvastatin 40 mg daily at bedtime. 2. Tylenol 325 mg 1 to 2 tablets every 4 hours as needed. 3. Furosemide  20 mg daily. 4. Hydralazine 25 mg 1 tablet 3 times daily. 5. Lantus insulin 10 units subcutaneously daily. 6. Levaquin 250 mg 1 tablet daily for the next 4 days. 7. Metoprolol tartrate 50 mg 1 tablet twice daily. 8. Allopurinol 100 mg daily. 9. Amlodipine 10 mg daily. 10.Aspirin 81 mg daily. 11.Benazepril 20 mg 1/2 tablet daily. 12.Novolin 70/30 insulin.  The patient is to take 25 units at     breakfast and 10 units at bedtime. 13.Prilosec 20 mg daily. 14.Synthroid 50 mcg daily.  DISPOSITION:  The patient was seen and exam by Dr. Johney Frame on February 19, 2011, consider stable to discharge back to [facility].  DURATION OF DISCHARGE ENCOUNTER:  35 minutes.     Gypsy Balsam,  RN,BSN   ______________________________ Hillis Range, MD    AS/MEDQ  D:  02/19/2011  T:  02/19/2011  Job:  [id number]  cc:   Doylene Canning. Ladona Ridgel, MD Melvyn Novas, MD  Electronically Signed by Gypsy Balsam RNBSN on 02/20/2011 09:39:37 AM Electronically Signed by Hillis Range MD on 03/02/2011 10:53:34 PM

## 2011-03-06 NOTE — Procedures (Signed)
Summary: wound check. metronic device per amber/tmj   Current Medications (verified): 1)  Levothroid 50 Mcg Tabs (Levothyroxine Sodium) .... Daily 2)  Amlodipine Besylate 10 Mg Tabs (Amlodipine Besylate) .... Daily 3)  Aspirin 81 Mg Tbec (Aspirin) .... Daily 4)  Prilosec 20 Mg Cpdr (Omeprazole) .... Daily 5)  Allopurinol 100 Mg Tabs (Allopurinol) .... Daily 6)  Novolin 70/30 70-30 % Susp (Insulin Isophane & Regular) .... 25 Units Before Breakfast 10 Units Before Dinner 7)  Benazepril Hcl 20 Mg Tabs (Benazepril Hcl) .... Daily 8)  Metoprolol Succinate 50 Mg Xr24h-Tab (Metoprolol Succinate) .... Daily 9)  Atorvastatin Calcium 40 Mg Tabs (Atorvastatin Calcium) .... Daily 10)  Tylenol 325 Mg Tabs (Acetaminophen) .... As Needed 11)  Furosemide 20 Mg Tabs (Furosemide) .... Daily 12)  Lantus 100 Unit/ml Soln (Insulin Glargine) .Marland Kitchen.. 10 Units Daily  Allergies (verified): No Known Drug Allergies   PPM Specifications Following MD:  Lewayne Bunting, MD     PPM Vendor:  Medtronic     PPM Model Number:  ADDRL1     PPM Serial Number:  [id number] PPM DOI:  02/16/2011     PPM Implanting MD:  Hillis Range, MD  Lead 1    Location: RA     DOI: 02/16/2011     Model #: 8413     Serial #: [id number]     Status: active Lead 2    Location: RV     DOI: 02/16/2011     Model #: 5366     Serial #: [id number]     Status: active  Magnet Response Rate:  BOL 85 ERI 65  Indications:  CHB  Tech Comments:  See [employer]

## 2011-03-06 NOTE — Cardiovascular Report (Signed)
Summary: Office Visit   Office Visit   Imported By: Roderic Ovens 03/01/2011 16:20:46  _____________________________________________________________________  External Attachment:    Type:   Image     Comment:   External Document

## 2011-03-07 ENCOUNTER — Inpatient Hospital Stay (HOSPITAL_COMMUNITY)
Admission: EM | Admit: 2011-03-07 | Discharge: 2011-03-12 | DRG: 641 | Disposition: A | Discharge status: Nursing Home (Includes ICF) | Payer: Medicare Other | Attending: Family Medicine | Admitting: Family Medicine

## 2011-03-07 ENCOUNTER — Emergency Department (HOSPITAL_COMMUNITY): Payer: Medicare Other

## 2011-03-07 DIAGNOSIS — Z950 Presence of cardiac pacemaker: Secondary | ICD-10-CM

## 2011-03-07 DIAGNOSIS — I10 Essential (primary) hypertension: Secondary | ICD-10-CM | POA: Diagnosis present

## 2011-03-07 DIAGNOSIS — F0390 Unspecified dementia without behavioral disturbance: Secondary | ICD-10-CM | POA: Diagnosis present

## 2011-03-07 DIAGNOSIS — E039 Hypothyroidism, unspecified: Secondary | ICD-10-CM | POA: Diagnosis present

## 2011-03-07 DIAGNOSIS — E119 Type 2 diabetes mellitus without complications: Secondary | ICD-10-CM | POA: Diagnosis present

## 2011-03-07 DIAGNOSIS — E871 Hypo-osmolality and hyponatremia: Principal | ICD-10-CM | POA: Diagnosis present

## 2011-03-07 LAB — DIFFERENTIAL
Basophils Relative: 0 % (ref 0–1)
Eosinophils Absolute: 0.1 10*3/uL (ref 0.0–0.7)
Lymphs Abs: 1.4 10*3/uL (ref 0.7–4.0)
Monocytes Absolute: 0.5 10*3/uL (ref 0.1–1.0)
Monocytes Relative: 8 % (ref 3–12)

## 2011-03-07 LAB — URINALYSIS, ROUTINE W REFLEX MICROSCOPIC
Bilirubin Urine: NEGATIVE
Hgb urine dipstick: NEGATIVE
Ketones, ur: NEGATIVE mg/dL
Protein, ur: NEGATIVE mg/dL
Urobilinogen, UA: 0.2 mg/dL (ref 0.0–1.0)

## 2011-03-07 LAB — COMPREHENSIVE METABOLIC PANEL
AST: 31 U/L (ref 0–37)
CO2: 26 mEq/L (ref 19–32)
Calcium: 8.9 mg/dL (ref 8.4–10.5)
Creatinine, Ser: 0.91 mg/dL (ref 0.4–1.2)
GFR calc Af Amer: >60 mL/min (ref >60)
GFR calc non Af Amer: 59 mL/min — ABNORMAL LOW (ref >60)

## 2011-03-07 LAB — CBC
Hemoglobin: 11.4 g/dL — ABNORMAL LOW (ref 12.0–15.0)
MCH: 29.6 pg (ref 26.0–34.0)
MCHC: 33.1 g/dL (ref 30.0–36.0)
MCV: 89.4 fL (ref 78.0–100.0)
Platelets: 376 10*3/uL (ref 150–400)

## 2011-03-07 LAB — GLUCOSE, CAPILLARY
Glucose-Capillary: 76 mg/dL (ref 70–99)
Glucose-Capillary: 172 mg/dL — ABNORMAL HIGH (ref 70–99)

## 2011-03-08 LAB — BASIC METABOLIC PANEL
BUN: 8 mg/dL (ref 6–23)
CO2: 26 mEq/L (ref 19–32)
Calcium: 8.8 mg/dL (ref 8.4–10.5)
Creatinine, Ser: 0.88 mg/dL (ref 0.4–1.2)
GFR calc Af Amer: >60 mL/min (ref >60)
Glucose, Bld: 62 mg/dL — ABNORMAL LOW (ref 70–99)

## 2011-03-08 LAB — GLUCOSE, CAPILLARY

## 2011-03-08 LAB — TSH: TSH: 1.876 u[IU]/mL (ref 0.350–4.500)

## 2011-03-09 ENCOUNTER — Encounter: Payer: Self-pay | Admitting: Internal Medicine

## 2011-03-09 ENCOUNTER — Ambulatory Visit: Payer: Medicare Other | Admitting: *Deleted

## 2011-03-09 LAB — BASIC METABOLIC PANEL
CO2: 24 mEq/L (ref 19–32)
Calcium: 8.5 mg/dL (ref 8.4–10.5)
Chloride: 102 mEq/L (ref 96–112)
Creatinine, Ser: 0.92 mg/dL (ref 0.4–1.2)
Glucose, Bld: 77 mg/dL (ref 70–99)
Sodium: 135 mEq/L (ref 135–145)

## 2011-03-09 LAB — CBC
HCT: 35 % — ABNORMAL LOW (ref 36.0–46.0)
Hemoglobin: 11.5 g/dL — ABNORMAL LOW (ref 12.0–15.0)
MCH: 29.9 pg (ref 26.0–34.0)
MCHC: 32.9 g/dL (ref 30.0–36.0)
RBC: 3.84 MIL/uL — ABNORMAL LOW (ref 3.87–5.11)

## 2011-03-09 LAB — GLUCOSE, CAPILLARY
Glucose-Capillary: 122 mg/dL — ABNORMAL HIGH (ref 70–99)
Glucose-Capillary: 100 mg/dL — ABNORMAL HIGH (ref 70–99)

## 2011-03-09 LAB — DIFFERENTIAL
Lymphocytes Relative: 32 % (ref 12–46)
Monocytes Absolute: 0.5 10*3/uL (ref 0.1–1.0)
Monocytes Relative: 12 % (ref 3–12)
Neutro Abs: 2.1 10*3/uL (ref 1.7–7.7)

## 2011-03-10 LAB — CBC
Hemoglobin: 11.5 g/dL — ABNORMAL LOW (ref 12.0–15.0)
MCH: 29.6 pg (ref 26.0–34.0)
Platelets: 340 10*3/uL (ref 150–400)
RBC: 3.88 MIL/uL (ref 3.87–5.11)
WBC: 4.3 10*3/uL (ref 4.0–10.5)

## 2011-03-10 LAB — GLUCOSE, CAPILLARY
Glucose-Capillary: 94 mg/dL (ref 70–99)
Glucose-Capillary: 171 mg/dL — ABNORMAL HIGH (ref 70–99)
Glucose-Capillary: 158 mg/dL — ABNORMAL HIGH (ref 70–99)
Glucose-Capillary: 59 mg/dL — ABNORMAL LOW (ref 70–99)
Glucose-Capillary: 74 mg/dL (ref 70–99)

## 2011-03-10 LAB — BASIC METABOLIC PANEL
CO2: 26 mEq/L (ref 19–32)
Chloride: 102 mEq/L (ref 96–112)
GFR calc Af Amer: >60 mL/min (ref >60)
Potassium: 3.7 mEq/L (ref 3.5–5.1)
Sodium: 134 mEq/L — ABNORMAL LOW (ref 135–145)

## 2011-03-10 LAB — DIFFERENTIAL
Basophils Relative: 1 % (ref 0–1)
Monocytes Relative: 10 % (ref 3–12)
Neutro Abs: 2.2 10*3/uL (ref 1.7–7.7)
Neutrophils Relative %: 51 % (ref 43–77)

## 2011-03-10 LAB — URINE CULTURE

## 2011-03-11 LAB — GLUCOSE, CAPILLARY
Glucose-Capillary: 154 mg/dL — ABNORMAL HIGH (ref 70–99)
Glucose-Capillary: 195 mg/dL — ABNORMAL HIGH (ref 70–99)
Glucose-Capillary: 79 mg/dL (ref 70–99)

## 2011-03-11 LAB — CBC
MCHC: 32.9 g/dL (ref 30.0–36.0)
Platelets: 317 10*3/uL (ref 150–400)
RDW: 13.8 % (ref 11.5–15.5)
WBC: 3.7 10*3/uL — ABNORMAL LOW (ref 4.0–10.5)

## 2011-03-11 LAB — DIFFERENTIAL
Basophils Absolute: 0 10*3/uL (ref 0.0–0.1)
Basophils Relative: 1 % (ref 0–1)
Eosinophils Relative: 9 % — ABNORMAL HIGH (ref 0–5)
Monocytes Absolute: 0.4 10*3/uL (ref 0.1–1.0)
Neutro Abs: 1.7 10*3/uL (ref 1.7–7.7)

## 2011-03-11 LAB — BASIC METABOLIC PANEL
Calcium: 8.5 mg/dL (ref 8.4–10.5)
GFR calc Af Amer: >60 mL/min (ref >60)
GFR calc non Af Amer: 55 mL/min — ABNORMAL LOW (ref >60)
Potassium: 3.9 mEq/L (ref 3.5–5.1)
Sodium: 132 mEq/L — ABNORMAL LOW (ref 135–145)

## 2011-03-12 LAB — GLUCOSE, CAPILLARY
Glucose-Capillary: 132 mg/dL — ABNORMAL HIGH (ref 70–99)
Glucose-Capillary: 207 mg/dL — ABNORMAL HIGH (ref 70–99)

## 2011-03-22 NOTE — H&P (Signed)
[name], TARKOWSKI            ACCOUNT NO.:  [id number]  MEDICAL RECORD NO.:  [id number]           PATIENT TYPE:  I  LOCATION:  A314                          FACILITY:  [facility]  PHYSICIAN:  Melvyn Novas, MDDATE OF BIRTH:  1925/12/02  DATE OF ADMISSION:  03/07/2011 DATE OF DISCHARGE:  LH                             HISTORY & PHYSICAL   The patient is an 77 year old female resident of assisted living facility recently had a battery change on her dual-chamber pacer and early March, the patient has had chronic dementia and increasing lethargy over several month period.  Brought in by the health care who coordinated with the assisted living facility.  CT scan of head shows chronic atrophy and question of diminished attenuation in the thalamus, which may be new.  More notably, she was noted to have a low sodium of 129 and this may be partially contributory to her altered mental status. She denies when prompted.  She is alert and oriented.  She denies chest pain, dyspnea, cough, sputum.  She is oriented to place and person. Chest x-ray revealed questionable left lower lobe atelectasis and possible infiltrate and she is empirically admitted for administration of intravenous normal saline to correct this issue and empiric Levaquin 250 low-dose due to her body weight.  PAST MEDICAL HISTORY:  Significant for chronic dementia, lethargy, hyponatremia, insulin dependent diabetes, hypertension, hypothyroidism which is well compensated, and gastroesophageal reflux disease.  PAST SURGICAL HISTORY:  Remarkable for appendectomy as well as dual- chamber pacemaker implantation.  CURRENT MEDICATIONS:  Synthroid 50 mcg p.o. daily, Toprol XL 50 mg p.o. daily, Norvasc 10 mg p.o. daily, aspirin 81 mg per day, Novolin 70/30 25 units in a.m. and 10 units a.c. b.i.d., and benazepril 10 mg per day.  ALLERGIES:  She has known allergies.  SOCIAL HISTORY:  She does not smoke, lives in assisted  nursing facility.  PHYSICAL EXAMINATION:  VITAL SIGNS:  Blood pressure is 150/53, temperature 98.2, respiratory rate is 20, and pulse is 63 and regular, O2 sat is 99%. EYES:  Extraocular movement is intact.  Sclerae clear.  Conjunctivae pink. NECK:  No JVD, no carotid bruits, no thyromegaly or carotid bruits. LUNGS:  Diminished breath sounds at bases.  No rales, wheeze, or rhonchi appreciable. HEART:  Regular rhythm, 1/6 aortic outflow murmur.  No S3 or S4, gallops, heaves, thrills, or rubs. ABDOMEN:  Soft, nontender.  Bowel sounds normoactive.  No guarding, rebound, or hepatosplenomegaly. EXTREMITIES:  Trace to 1+ pedal edema. NEUROLOGIC:  The patient moves all 4 extremities.  Plantars are downgoing.  Cranial nerves grossly intact.  IMPRESSION: 1. Altered mental status, acute on chronic. 2. Hyponatremia, sodium of 129. 3. Questionable left lower lobe atelectasis or infiltrate. 4. Hypertension. 5. Insulin dependent diabetes. 6. Hypothyroidism, well compensated. 7. Recent change of battery on dual-chamber pacer.  PLAN:  At present is to give normal saline, serial BMETs, check sodium, empiric Levaquin.  We will observe neurologic status over several days and make further recommendations as the database expands.     Melvyn Novas, MD     RMD/MEDQ  D:  03/08/2011  T:  03/08/2011  Job:  [id number]  Electronically Signed by Oval Linsey MD on 03/22/2011 06:46:53 AM

## 2011-03-22 NOTE — Discharge Summary (Signed)
  [name], [name]            ACCOUNT NO.:  [id number]  MEDICAL RECORD NO.:  [id number]           PATIENT TYPE:  I  LOCATION:  A314                          FACILITY:  [facility]  PHYSICIAN:  Melvyn Novas, MDDATE OF BIRTH:  [birth year]  DATE OF ADMISSION:  03/07/2011 DATE OF DISCHARGE:  LH                              DISCHARGE SUMMARY   The patient is and 77 year old black female, resident of assisted living with recurrences of questionable altered mental status, was found to have clean urine, questionable left lower lobe atelectasis or infiltrate on chest x-ray.  No fever, no leukocytosis, no clinical pneumonia.  She was also hyponatremic with sodium in the 126 range.  She was repleted with IV normal saline throughout hospital admission, empirically given Levaquin 250 IV q.24 h had no clinical cough, pleuritic chest pain, hemoptysis, sputum, rigors and chills.  She had no dysuria, frequency. Urinalysis on admission was within normal parameters.  A urine culture grew out staph coagulase negative for greater than 100,000 colonies. Not sure if this is contaminant, however, clinically does not fit the picture with UTI.  The patient is doing well.  Her sodium came back up to 132 with normal saline and she was subsequently discharged on essentially the same medicines she came in on. 1. Her 70/30 insulin reduced to 10 units subcu a.c. before breakfast     and supper. 2. Synthroid 50 mcg p.o. daily. 3. Toprol-XL 50 mg p.o. daily. 4. Norvasc 10 mg p.o. daily. 5. Aspirin 81 mg p.o. daily. 6. Benazepril 10 mg p.o. daily.  The patient will follow up in the office in 1 week's time for BMET and monitoring of glycemic control.     Melvyn Novas, MD     RMD/MEDQ  D:  03/12/2011  T:  03/12/2011  Job:  [id number]  Electronically Signed by Oval Linsey MD on 03/22/2011 06:46:50 AM

## 2011-03-23 LAB — URINALYSIS, ROUTINE W REFLEX MICROSCOPIC
Bilirubin Urine: NEGATIVE
Glucose, UA: >1000 mg/dL — AB
Glucose, UA: NEGATIVE mg/dL
Hgb urine dipstick: NEGATIVE
Ketones, ur: NEGATIVE mg/dL
Protein, ur: NEGATIVE mg/dL
Protein, ur: NEGATIVE mg/dL
pH: 7.5 (ref 5.0–8.0)

## 2011-03-23 LAB — CBC
MCHC: 33.8 g/dL (ref 30.0–36.0)
MCHC: 34 g/dL (ref 30.0–36.0)
MCV: 89.3 fL (ref 78.0–100.0)
Platelets: 286 10*3/uL (ref 150–400)
RDW: 14.3 % (ref 11.5–15.5)
WBC: 6.5 10*3/uL (ref 4.0–10.5)

## 2011-03-23 LAB — GLUCOSE, CAPILLARY
Glucose-Capillary: 108 mg/dL — ABNORMAL HIGH (ref 70–99)
Glucose-Capillary: 168 mg/dL — ABNORMAL HIGH (ref 70–99)
Glucose-Capillary: 179 mg/dL — ABNORMAL HIGH (ref 70–99)
Glucose-Capillary: 179 mg/dL — ABNORMAL HIGH (ref 70–99)
Glucose-Capillary: 192 mg/dL — ABNORMAL HIGH (ref 70–99)
Glucose-Capillary: 194 mg/dL — ABNORMAL HIGH (ref 70–99)
Glucose-Capillary: 234 mg/dL — ABNORMAL HIGH (ref 70–99)
Glucose-Capillary: 245 mg/dL — ABNORMAL HIGH (ref 70–99)
Glucose-Capillary: 296 mg/dL — ABNORMAL HIGH (ref 70–99)
Glucose-Capillary: 336 mg/dL — ABNORMAL HIGH (ref 70–99)
Glucose-Capillary: 35 mg/dL — CL (ref 70–99)
Glucose-Capillary: 38 mg/dL — CL (ref 70–99)
Glucose-Capillary: 41 mg/dL — ABNORMAL LOW (ref 70–99)
Glucose-Capillary: 46 mg/dL — ABNORMAL LOW (ref 70–99)
Glucose-Capillary: 60 mg/dL — ABNORMAL LOW (ref 70–99)
Glucose-Capillary: 76 mg/dL (ref 70–99)
Glucose-Capillary: 80 mg/dL (ref 70–99)
Glucose-Capillary: 96 mg/dL (ref 70–99)
Glucose-Capillary: 97 mg/dL (ref 70–99)
Glucose-Capillary: 116 mg/dL — ABNORMAL HIGH (ref 70–99)
Glucose-Capillary: 119 mg/dL — ABNORMAL HIGH (ref 70–99)
Glucose-Capillary: 129 mg/dL — ABNORMAL HIGH (ref 70–99)
Glucose-Capillary: 52 mg/dL — ABNORMAL LOW (ref 70–99)
Glucose-Capillary: 55 mg/dL — ABNORMAL LOW (ref 70–99)
Glucose-Capillary: 85 mg/dL (ref 70–99)
Glucose-Capillary: 97 mg/dL (ref 70–99)

## 2011-03-23 LAB — CARDIAC PANEL(CRET KIN+CKTOT+MB+TROPI)
CK, MB: 3.5 ng/mL (ref 0.3–4.0)
CK, MB: 5.3 ng/mL — ABNORMAL HIGH (ref 0.3–4.0)
CK, MB: 6 ng/mL — ABNORMAL HIGH (ref 0.3–4.0)
Relative Index: 1.9 (ref 0.0–2.5)
Relative Index: 2 (ref 0.0–2.5)
Relative Index: 2 (ref 0.0–2.5)
Relative Index: 2 (ref 0.0–2.5)
Relative Index: 2 (ref 0.0–2.5)
Total CK: 179 U/L — ABNORMAL HIGH (ref 7–177)
Total CK: 186 U/L — ABNORMAL HIGH (ref 7–177)
Total CK: 187 U/L — ABNORMAL HIGH (ref 7–177)
Total CK: 268 U/L — ABNORMAL HIGH (ref 7–177)
Total CK: 295 U/L — ABNORMAL HIGH (ref 7–177)
Troponin I: 0.04 ng/mL (ref 0.00–0.06)
Troponin I: 0.06 ng/mL (ref 0.00–0.06)
Troponin I: 0.06 ng/mL (ref 0.00–0.06)
Troponin I: 0.06 ng/mL (ref 0.00–0.06)
Troponin I: 0.04 ng/mL (ref 0.00–0.06)
Troponin I: 0.05 ng/mL (ref 0.00–0.06)

## 2011-03-23 LAB — DIFFERENTIAL
Basophils Relative: 0 % (ref 0–1)
Eosinophils Absolute: 0.1 10*3/uL (ref 0.0–0.7)
Eosinophils Absolute: 0.1 10*3/uL (ref 0.0–0.7)
Lymphocytes Relative: 22 % (ref 12–46)
Lymphs Abs: 0.9 10*3/uL (ref 0.7–4.0)
Monocytes Relative: 7 % (ref 3–12)
Neutro Abs: 5.2 10*3/uL (ref 1.7–7.7)
Neutrophils Relative %: 80 % — ABNORMAL HIGH (ref 43–77)
Neutrophils Relative %: 70 % (ref 43–77)

## 2011-03-23 LAB — COMPREHENSIVE METABOLIC PANEL
ALT: 17 U/L (ref 0–35)
AST: 28 U/L (ref 0–37)
Calcium: 9.5 mg/dL (ref 8.4–10.5)
GFR calc Af Amer: 55 mL/min — ABNORMAL LOW (ref >60)
Sodium: 135 mEq/L (ref 135–145)
Total Protein: 7.6 g/dL (ref 6.0–8.3)

## 2011-03-23 LAB — POCT CARDIAC MARKERS
CKMB, poc: 3.6 ng/mL (ref 1.0–8.0)
Myoglobin, poc: 167 ng/mL (ref 12–200)
Myoglobin, poc: 281 ng/mL (ref 12–200)
Myoglobin, poc: >500 ng/mL (ref 12–200)

## 2011-03-23 LAB — BASIC METABOLIC PANEL
BUN: 13 mg/dL (ref 6–23)
BUN: 14 mg/dL (ref 6–23)
CO2: 28 mEq/L (ref 19–32)
Calcium: 9.3 mg/dL (ref 8.4–10.5)
Chloride: 93 mEq/L — ABNORMAL LOW (ref 96–112)
Creatinine, Ser: 0.96 mg/dL (ref 0.4–1.2)
Creatinine, Ser: 1.05 mg/dL (ref 0.4–1.2)
GFR calc non Af Amer: 56 mL/min — ABNORMAL LOW (ref >60)
Glucose, Bld: 76 mg/dL (ref 70–99)

## 2011-03-23 LAB — URINE MICROSCOPIC-ADD ON

## 2011-03-26 ENCOUNTER — Telehealth: Payer: Self-pay | Admitting: Internal Medicine

## 2011-03-26 NOTE — Progress Notes (Signed)
  [name], [name]            ACCOUNT NO.:  [id number]  MEDICAL RECORD NO.:  [id number]           PATIENT TYPE:  I  LOCATION:  A314                          FACILITY:  [facility]  PHYSICIAN:  Mila Homer. Sudie Bailey, M.D.DATE OF BIRTH:  12-11-1925  DATE OF PROCEDURE:  03/11/2011 DATE OF DISCHARGE:                                PROGRESS NOTE   SUBJECTIVE:  The patient feels somewhat better today.  OBJECTIVE:  Temperature 98.4, pulse 66, respiratory rate 20, blood pressure 131/66.  She is sitting up in bed eating breakfast.  She is in no acute distress.  Her speech is normal and she appears somewhat oriented. LUNGS:  Clear throughout. HEART:  Regular rhythm without murmur, but both lung sounds and heart sounds are somewhat faint.  She has no edema of the ankles.  Her O2 saturation is  95% on room air.  Glucoses have been 79, 74, 59 and 158.  Today's white cell count is 3700 and hemoglobin 11.6.  Her sodium is 132, chloride 94, glucose 138.  ASSESSMENT: 1. Urinary tract infection secondary to Enterococcus and     Staphylococcus coag-negative. 2. Dehydration, improved. 3. Confusion, improved. 4. Hyponatremia, stable.  Given her age of 71, her hyponatremia of 132     may be normal for her.  PLAN:  I am discontinuing the IV and switching to Hep-Lock, getting her up in a chair.  I talked to nursing about her.  She is able to get up and move around and is just a little bit weak.  Hopefully we will get her ambulating and home in a day or two.     Mila Homer. Sudie Bailey, M.D.     SDK/MEDQ  D:  03/11/2011  T:  03/11/2011  Job:  [id number]  Electronically Signed by John Giovanni M.D. on 03/26/2011 05:30:18 AM

## 2011-03-26 NOTE — Progress Notes (Signed)
  NAMECYNITHA, [name]            ACCOUNT NO.:  [id number]  MEDICAL RECORD NO.:  [id number]           PATIENT TYPE:  I  LOCATION:  A314                          FACILITY:  [facility]  PHYSICIAN:  [name], M.D.DATE OF BIRTH:  Sep 05, 1925  DATE OF PROCEDURE: DATE OF DISCHARGE:                                PROGRESS NOTE   SUBJECTIVE:  This 77 year old was admitted to the hospital dehydrated and somewhat confused several days ago.  She is much better now on IV fluids and IV Levaquin.  Her brother-in-law is in the room with her today.  OBJECTIVE:  VITAL SIGNS:  Temperature is 97.6, pulse 68, respiratory rate 20, blood pressure 136/69. GENERAL:  She is sitting up in bed, semi-recumbent.  She appears to be fairly cognisant of what is going on.  She is alert. SKIN:  Turgor is normal.  Mucous membranes are moist. HEART:  Regular rhythm with a rate of about 80 with about a 1/6 systolic ejection murmur. LUNGS:  Appear to be clear throughout.  She is moving air well without intercostal retractions. ABDOMEN:  Soft without organomegaly or mass or tenderness.  She has no CVA or flank pain or suprapubic tenderness.  Her admission sodium was 129, now up to 134.  Her hemoglobin was 11.5. glucose 171.  O2 sats 96% room air.  Her urine now is growing out greater than 100,000 enterococcus and staphylococcus coag-negative.  The staph is sensitive to doxycycline and the enterococcus to nitrofurantoin.  ASSESSMENT: 1. Urinary tract infection. 2. Dehydration. 3. Confusion brought on by age, mild dementia and the urinary tract     infection. 4. Hyponatremia, improving.  PLAN:  Decrease the IV fluids from 125 to 50 mL/hour.  Discontinue Levaquin.  Start on nitrofurantoin 150 mg b.i.d. and doxycycline 100 mg b.i.d.  I discussed with the patient and her brother-in-law.     [name], M.D.    SDK/MEDQ  D:  03/10/2011  T:  03/10/2011  Job:  [id number]  Electronically Signed by  John Giovanni M.D. on 03/26/2011 05:30:08 AM

## 2011-03-26 NOTE — Telephone Encounter (Signed)
Patient has been vommitting and is complaining with pain at site of pacemaker / pls call [name]'s at above number / tg

## 2011-04-17 NOTE — Discharge Summary (Signed)
  NAMEJETAIME, [name]            ACCOUNT NO.:  [id number]  MEDICAL RECORD NO.:  [id number]           PATIENT TYPE:  LOCATION:                                 FACILITY:  PHYSICIAN:  Melvyn Novas, MDDATE OF BIRTH:  [birth year]  DATE OF ADMISSION: DATE OF DISCHARGE:  LH                              DISCHARGE SUMMARY   This is done 2 to 3 weeks after discharge from recollection of the EMR in perusal.  The patient is an 77 year old black female resident of skilled nursing facility whose hypertension, insulin-dependent diabetes has some sort of affective disorder where she sort of ignores health care workers and then it was stated that she was unresponsive; however, she is alert and oriented in three spheres this has been recurrent over several years now.  Essentially, she was seen in ER for a nonresponsive episode and found to have a questionable left lower lobe atelectasis or infiltrate without fever, cough, hemoptysis, or leukocytosis.  She also had accompanying hyponatremia.  The patient was subsequently admitted for dual antibiotics in the form of Rocephin and Zithromax and repletion with intravenous normal saline.  This occurred over 2- to 3-day period. The patient was alert and oriented throughout her entire hospital stay. There was no seizure activity, minimal cardiac enzyme elevation on admission reveals no spiking or any evidence of angina, or anginal equivalents.  Thyroid functions and rest of her blood analysis were apparently within normal parameters for age.  The patient has her sodium corrected to normal and was subsequently discharged on Zithromax 250 mg p.o. daily as well as her antecedent Norvasc 10 mg per day, Lopressor 50 p.o. b.i.d., benazepril 10 mg p.o. daily, Synthroid 50 mg per day, allopurinol 200 mg per day, and Prilosec 20 mg per day.  She also takes insulin 70/30 12 units subcu in a.m. and p.m. before breakfast and supper.     Melvyn Novas, MD     RMD/MEDQ  D:  03/22/2011  T:  03/22/2011  Job:  [id number]  Electronically Signed by Oval Linsey MD on 04/17/2011 01:42:25 PM

## 2011-04-18 ENCOUNTER — Emergency Department (HOSPITAL_COMMUNITY): Payer: Medicare Other

## 2011-04-18 ENCOUNTER — Emergency Department (HOSPITAL_COMMUNITY)
Admission: EM | Admit: 2011-04-18 | Discharge: 2011-04-18 | Disposition: A | Discharge status: Home / Self Care | Payer: Medicare Other | Attending: Emergency Medicine | Admitting: Emergency Medicine

## 2011-04-18 DIAGNOSIS — Z950 Presence of cardiac pacemaker: Secondary | ICD-10-CM | POA: Insufficient documentation

## 2011-04-18 DIAGNOSIS — R112 Nausea with vomiting, unspecified: Secondary | ICD-10-CM | POA: Insufficient documentation

## 2011-04-18 DIAGNOSIS — Z794 Long term (current) use of insulin: Secondary | ICD-10-CM | POA: Insufficient documentation

## 2011-04-18 DIAGNOSIS — Z79899 Other long term (current) drug therapy: Secondary | ICD-10-CM | POA: Insufficient documentation

## 2011-04-18 DIAGNOSIS — D649 Anemia, unspecified: Secondary | ICD-10-CM | POA: Insufficient documentation

## 2011-04-18 DIAGNOSIS — E119 Type 2 diabetes mellitus without complications: Secondary | ICD-10-CM | POA: Insufficient documentation

## 2011-04-18 DIAGNOSIS — F0390 Unspecified dementia without behavioral disturbance: Secondary | ICD-10-CM | POA: Insufficient documentation

## 2011-04-18 DIAGNOSIS — Z7982 Long term (current) use of aspirin: Secondary | ICD-10-CM | POA: Insufficient documentation

## 2011-04-18 DIAGNOSIS — I509 Heart failure, unspecified: Secondary | ICD-10-CM | POA: Insufficient documentation

## 2011-04-18 DIAGNOSIS — I10 Essential (primary) hypertension: Secondary | ICD-10-CM | POA: Insufficient documentation

## 2011-04-18 DIAGNOSIS — I4891 Unspecified atrial fibrillation: Secondary | ICD-10-CM | POA: Insufficient documentation

## 2011-04-18 LAB — COMPREHENSIVE METABOLIC PANEL
ALT: 17 U/L (ref 0–35)
Albumin: 3.5 g/dL (ref 3.5–5.2)
Alkaline Phosphatase: 76 U/L (ref 39–117)
BUN: 16 mg/dL (ref 6–23)
Chloride: 93 mEq/L — ABNORMAL LOW (ref 96–112)
Potassium: 3.7 mEq/L (ref 3.5–5.1)
Sodium: 134 mEq/L — ABNORMAL LOW (ref 135–145)
Total Bilirubin: 1.7 mg/dL — ABNORMAL HIGH (ref 0.3–1.2)
Total Protein: 7.2 g/dL (ref 6.0–8.3)

## 2011-04-18 LAB — CBC
HCT: 37.4 % (ref 36.0–46.0)
MCV: 91.4 fL (ref 78.0–100.0)
Platelets: 197 10*3/uL (ref 150–400)
RBC: 4.09 MIL/uL (ref 3.87–5.11)
RDW: 13.9 % (ref 11.5–15.5)
WBC: 7.1 10*3/uL (ref 4.0–10.5)

## 2011-04-18 LAB — URINALYSIS, ROUTINE W REFLEX MICROSCOPIC
Glucose, UA: NEGATIVE mg/dL
Hgb urine dipstick: NEGATIVE
Protein, ur: NEGATIVE mg/dL
Urobilinogen, UA: 1 mg/dL (ref 0.0–1.0)

## 2011-04-18 LAB — DIFFERENTIAL
Basophils Absolute: 0 10*3/uL (ref 0.0–0.1)
Eosinophils Absolute: 0.3 10*3/uL (ref 0.0–0.7)
Lymphocytes Relative: 12 % (ref 12–46)
Lymphs Abs: 0.9 10*3/uL (ref 0.7–4.0)
Neutrophils Relative %: 81 % — ABNORMAL HIGH (ref 43–77)

## 2011-05-04 NOTE — Op Note (Signed)
NAME:  [name], [name]                      ACCOUNT NO.:  [id number]   MEDICAL RECORD NO.:  [id number]                   PATIENT TYPE:  INP   LOCATION:  3731                                 FACILITY:  [facility]   PHYSICIAN:  Charlies Constable, M.D. [facility]              DATE OF BIRTH:  [birth year]   DATE OF PROCEDURE:  12/08/2002  DATE OF DISCHARGE:                                 OPERATIVE REPORT   CLINICAL HISTORY:  The patient is a 77 years old and was admitted with  syncope. She was monitored in the hospital for several days and then was  found to have second-degree AV block along with trifascicular block on her  baseline ECG.  It was felt that a permanent pacemaker was indicated.  Her  other problems include a mild hypotrophic cardiomyopathy with a septum  dementia of 17 mm, history of paroxysmal atrial fibrillation, hypertension,  diabetes, and hyperlipidemia.   PROCEDURE:  Implantation of a Medtronic Kappa DDD generator (model [id number],  serial [id number]) with a Medtronic bipolar screw-in ventricular lead  (model #5076- 52 cm, serial #[id number]) and a Medtronic bipolar screw-in  atrial lead (model #5076-45 cm, serial #[id number]).   ANESTHESIA:  Xylocaine local 1%.   ESTIMATED BLOOD LOSS:  Less than 20 cc.   COMPLICATIONS:  None.   DESCRIPTION OF PROCEDURE:  The procedure was performed in the laboratory  room #2.  The right anterior chest was prepared and draped in the usual  fashion.  A venogram was performed prior to insertion. The skin and  subcutaneous tissue were anesthetized with 1% local Xylocaine.  Using an 18-  gauge thin wall needle, the subclavian vein was entered and access was  secured with a 0.038 wire. An incision was made below the clavicle and  extended to the prepectoral fascia.  A pocket was made inferior to the  incision using blunt dissection.  Using two 9 French sheaths the atrial and  ventricular leads were passed to the right atrium.  The ventricular lead  was  positioned in the right ventricular apex with good pacing parameters  described below. The atrial lead was screwed into the right lateral atrial  wall with good pacing parameters described below.  We had difficulty  manipulating the atrial lead and were not able to place a lead in the atrial  appendage with the J stylet.  After removal of the stylets and the 0.038  wire a figure-of-eight suture was secured at the entry site.  The leads were  attached to the prepectoral fascia with two sutures of 1-0 silk around each  silastic collar. The pocket was irrigated with sterile kanamycin solution.  The leads were attached to the generator and the generator was implanted  into the pocket and secured loosely to the prepectoral fascia with 1-0 silk.  The subcutaneous tissue was closed with running 2-0 Dexon.  The skin was  closed with running 5-0 Dexon.  PACING PARAMETERS:  1. Atrial bipolar:  P wave 4.2 mV.  Minimal pressure capture 1.3 volts.     Resistance 692 ohms.  2. Atrial unipolar.  P wave 3.2 mV.  Minimal pressure capture 0.5 volts.     Resistance 501 ohms.  3. Ventricular bipolar:  RV 8.5 mV.  Minimal pressure capture 0.7 volts.     Resistance 794 ohms.  4.     Ventricular unipolar:  RV of 8.7 mV.  Minimal pressure capture is 0.4 volts.     Resistance 633 ohms.  5. There was no pacing in the diaphragm in either the unipolar or bipolar     mode on either lead at 10 volts.   DISPOSITION:  The patient tolerated the procedure and left the laboratory in  satisfactory condition.                                               Charlies Constable, M.D. [facility]    BB/MEDQ  D:  12/08/2002  T:  12/09/2002  Job:  [id number]   cc:   Doylene Canning. Ladona Ridgel, M.D. [facility]  [address]  [place]  [place] [address]  Fax: [phone]   Duke Salvia, M.D. [facility]   Cardiopulmonary Laboratory

## 2011-05-04 NOTE — Op Note (Signed)
[name], [name]            ACCOUNT NO.:  [id number]   MEDICAL RECORD NO.:  [id number]          PATIENT TYPE:  INP   LOCATION:  A327                          FACILITY:  [facility]   PHYSICIAN:  Dalia Heading, M.D.  DATE OF BIRTH:  [birth year]   DATE OF PROCEDURE:  07/29/2006  DATE OF DISCHARGE:                                 OPERATIVE REPORT   PREOPERATIVE DIAGNOSIS:  Incarcerated incisional hernia.   POSTOPERATIVE DIAGNOSIS:  Incarcerated incisional hernia.   PROCEDURE:  Incarcerated incisional herniorrhaphy.   SURGEON:  Dalia Heading, M.D.   ANESTHESIA:  General endotracheal.   INDICATIONS:  The patient is a 77 year old black female who suffers from  dementia who presents with an incarcerated incisional hernia.  This appears  to be in the periumbilical region.  This could not be reduced in the  emergency room.  The patient has been having nausea and vomiting as well as  abdominal pain.  The risks and benefits of the procedure including bleeding,  infection, cardiopulmonary difficulties, and the possibility of a bowel  resection were fully explained to the patient's sister, who gives informed  consent for the patient, as the patient has suffers from dementia.   PROCEDURE NOTE:  The patient was placed in the supine position.  After  induction of general endotracheal anesthesia, the abdomen was prepped and  draped using the usual sterile technique with Betadine.  Surgical site  confirmation was performed.   A midline incision was made from above the umbilicus to below the umbilicus.  This was through the previous surgical scar and directly over the  incarcerated hernia.  This was taken down to the fascia.  The hernia sac was  incised.  Omentum as well as a loop of small intestine was noted to be  present.  The loop of small intestine was noted to be somewhat cyanotic.  This was reduced and observed.  The small bowel segment ultimately pinked up  and did not have to be  resected.  The omentum was freed away from the hernia  sac.  The hernia sac was excised and sent to pathology for further  examination.  A small umbilical hernia defect was closed using #0 Novofil  interrupted suture.  The midline fascia was then reapproximated using a  looped #0 Novofil running suture.  The subcutaneous layer was reapproximated  using a 2-0 Vicryl interrupted suture.  The skin was closed using staples.  Betadine ointment and dry sterile dressings were applied.   All tape and needle counts were correct at the end of the procedure. The  patient was extubated in the operating room and went back to recovery room  awake in stable condition.   COMPLICATIONS:  None.   SPECIMEN:  Hernia sac.  Blood loss minimal.      Dalia Heading, M.D.  Electronically Signed     MAJ/MEDQ  D:  07/29/2006  T:  07/30/2006  Job:  [id number]   cc:   Tesfaye D. Felecia Shelling, MD  Fax: [phone]

## 2011-05-04 NOTE — Consult Note (Signed)
[facility]  Patient:    [name], [name] Visit Number: [id number] MRN: [id number]          Service Type: MED Location: 3A [id number] Attending Physician:  Hilario Quarry Dictated by:   Syliva Overman, M.D. Proc. Date: 04/22/02 Admit Date:  04/22/2002                            Consultation Report  REFERRING PHYSICIAN:  Darreld Mclean, M.D.  HISTORY OF PRESENT ILLNESS:  Ms. Fulp is a 77 year old African-American female who lives in a family care setting who slipped on wet floor today and sustained a fracture to her left hip, displaced fracture of the left femoral neck. The patient is admitted by orthopedics for open reduction and internal fixation of same.  PAST MEDICAL HISTORY:   Significant for hypertension and insulin dependent diabetes for approximately 10 years. She has a history of psychotic disorder and is on chronic medication for same. Her most recent hospitalization was at [facility] in May of 2002 for atypical chest pain. At that time, she had a negative cardiolite study and an acute MI was ruled out. Her other significant cardiac history is that of a systolic murmur secondary to left ventricular outflow tract turbulence by echocardiogram. She also has a history of paroxysmal atrial fibrillation, paroxysmal SVT and sick sinus syndrome which was stable at the time of discharge in 2000.  SURGICAL HISTORY:  The patient has had abdominal surgery, she is not certain exactly what was done; otherwise, she denies any surgery.  ALLERGIES:  None known.  MEDICATIONS ON ADMISSION:  1. Humulin 70/30. The patient reports she takes between 15-30 units in the     morning depending on her blood sugars.  2. Norvasc 10 mg daily.  3. Altace 5 mg twice daily.  4. Diovan 80/12.5 one daily.  5. Thorazine 25 mg four times daily.  6. Coated aspirin 325 mg daily.  SOCIAL HISTORY:  The patient has been widowed for over 20 years and has  not lived independent for quite some time. She has no children. She said that she did drink alcohol but quit 25 years ago and did smoke cigarettes and quit approximately 10 years ago.  REVIEW OF SYSTEMS:  CARDIOVASCULAR:  The patient denies chest pain, palpitations, shortness of breath, leg edema, PND or orthopnea. RESPIRATORY: The patient denies sinus pressure, sore throat or productive cough. GI:  The patient reports normal appetite and normal bowel movement. CNS:  The patient denies any chronic headaches, extremity pain or weakness. GU:  The patient denies dysuria or frequency.  PHYSICAL EXAMINATION AT TIME OF CONSULTATION:  GENERAL:  She was alert and oriented x4 in no cardiopulmonary distress.  VITAL SIGNS:  Temperature was 99, pulse 82, respirations 16, blood pressure 159/55. Accu-Chek on admission was 208.  HEENT:  Neck supple with no adenopathy. No JVD, no bruits. Extraocular muscles intact. Oropharynx moist. No facial asymmetry.  CHEST:  Revealed adequate air entry, no crackles or wheezes heard.  CARDIOVASCULAR:  Heart sounds 1 and 2, no murmurs, no S3.  ABDOMEN:  Obese, nontender, no palpable organomegaly or masses. Bowel sounds present and normal.  RECTAL:  Deferred.  EXTREMITIES:  The right lower extremity was negative for edema of the lower calf, tenderness or swelling. The left lower extremity was immobilized and in traction.  SIGNIFICANT LABORATORY DATA:  She was blood cross and typed, she is B+, antibody screen negative.  ABGs on room air, pH 7.45, CO2 39.3, PO2 62.8 and O2 saturations 92.5. Urinalysis was hazy, however, it was negative for esterase, nitrites or ketones. It did have 100 mg per deciliter of glucose. White cell count was 8.9, hemoglobin 11.9, platelet count 267. Chem 7, sodium 132, potassium 3.7, chloride 101, CO2 25, BUN 21, creatinine 1.1, glucose 90, calcium 8.6.  RADIOLOGIC:  X-rays showed a displaced femoral neck fracture,  osteoporosis, cardiomegaly and left base scarring. EKG at the time of this dictation is still pending, I have requested same.  IMPRESSION:  My evaluation at this time is the patient is a 77 year old female who has acutely sustained a fracture of the left hip. In my opinion, she is stable for surgery. She should be maintained on her regular medications. I have started her on heparin 5000 units subcu prophylactically. She should have O2 saturations checked q. shift to maintain saturations at 92% or more and will probably benefit from supplemental oxygen postoperatively with use of incentive spirometry. A Foley catheter will be put in for drainage for convenience. Her EKG will be followed up in the morning. Pain management is per orthopedics. Dictated by:   Syliva Overman, M.D. Attending Physician:  Hilario Quarry DD:  04/22/02 TD:  04/23/02 Job: [id number]

## 2011-05-04 NOTE — H&P (Signed)
[facility]  Patient:    [name], [name] Visit Number: [id number] MRN: [id number]          Service Type: MED Location: 3A [phone] Attending Physician:  Hilario Quarry Dictated by:   Darreld Mclean, M.D. Admit Date:  04/22/2002                           History and Physical  CHIEF COMPLAINT:  "I fell and hurt my hip."  HISTORY OF PRESENT ILLNESS:  The patient is a 77 year old female, resident of a local rest home, fell and injured her left hip this afternoon.  X-rays show subcapital fracture on the left, displaced femoral neck area.  No other injury sustained.  No head injury.  There is shortening, external rotation of the left leg.  PAST MEDICAL HISTORY:  The patient denies any surgeries except she has had a cardiac catheterization.  CURRENT MEDICATIONS: 1. Aspirin 325 mg a day. 2. Thorazine 25 mg 1 p.o. four times a day. 3. Diovan 1 a day by mouth. 4. Norvasc 10 mg 1 a day by mouth. 5. Altace 5 mg 1 by mouth twice daily. 6. Tylenol as needed for mild pain. 7. The patient is on insulin with directions of ______ 125-180, 15 units;    181-225, 18 units; 226-250, 20 units; 251-300, 25 units; 301-350, 30 units;    350-375, 35 units.  REVIEW OF SYSTEMS:  The patient denies stroke, paralysis, or weakness.  She has had a history of pneumonia.  She has had a history of cardiac arrhythmias and hypertension.  ALLERGIES:  The patient denies any allergies.  Her sister is present with her today.  PHYSICAL EXAMINATION:  VITAL SIGNS:  Within normal limits.  HEENT:  Negative.  NECK:  Supple.  LUNGS:  Clear to P&A.  HEART:  Regular rhythm, with 2/6 systolic ejection murmur.  ABDOMEN:  Soft, nontender.  Without masses.  EXTREMITIES:  Left lower extremity shortened, externally rotated.  Painful to move the hip.  Neurovascular intact.  Other extremities within normal limits.  CNS:  Intact.  SKIN:  Intact.  IMPRESSION: 1. Acute fracture  of left femoral neck/subcapital. 2. History of hypertension. 3. History of chest pain.  PLAN:  The patient will need a bipolar hip on the left.  I discussed with the patient the planned procedure, risks, and imponderables.  I discussed this with her sister as well.  These include infection, leg lengthening and equality, pulmonary embolus which could lead to death, the need for physical therapy, limited weightbearing, anesthesia risks, among others.  Laboratories are pending.  They appear to understand and agree to the procedure as outlined.  I will get a consult from Dr. Loleta Chance preoperatively, particularly to make sure her cardiac status is satisfactory so I can proceed with the surgery tomorrow. She is tentatively scheduled for surgery on Thursday.  If not, we can postpone it until Friday or later, depending on Dr. Jorge Ny recommendations and evaluation. Dictated by:   Darreld Mclean, M.D. Attending Physician:  Hilario Quarry DD:  04/22/02 TD:  04/23/02 Job: [id number]

## 2011-05-04 NOTE — Discharge Summary (Signed)
[place]  Patient:    [name], [name]                   MRN: [id number] Adm. Date:  [id number] Disc. Date: [id number] Attending:  Mirian Mo Dictator:   Abelino Derrick, P.A.C. [facility] CC:         Mirna Mires, M.D., [address] [place]  [address], [place], [place] [address]   Referring Physician Discharge [facility]  DISCHARGE DIAGNOSES: 1. Chest pain, myocardial infarction ruled out this admission, negative    Cardiolite this admission. 2. Paroxysmal atrial fibrillation, paroxysmal supraventricular tachycardia,    and sick sinus syndrome, stable at discharge. 3. Hypertension. 4. Insulin-dependent diabetes. 5. Systolic murmur, left ventricular outflow tract turbulence by    echocardiogram.  HISTORY OF PRESENT ILLNESS:  The patient is a 77 year old female followed by Mirna Mires, M.D., with a history of diabetes and hypertension.  We saw her for chest pain on May 12, 2001.  She has no prior cardiac history and is a poor historian.  She was seen at [facility] and her blood pressure was noted to be elevated.  She was transferred to [facility].  HOSPITAL COURSE:  She was initially admitted by Madolyn Frieze. Jens Som, M.D.  The CK was elevated at 254, but MB was negative and troponin was negative.  The EKG showed sinus bradycardia, first degree AV block, right bundle branch block, and left anterior fascicular block.  The patient was admitted to telemetry and set up for a Cardiolite study, which was done on May 13, 2001. This revealed no ischemia.  There was some inferoseptal hypokinesis.  Her EF was 57%.  Carotid artery Dopplers revealed no significant internal carotid artery stenosis.  She was watched on telemetry.  She had runs of atrial flutter and sinus bradycardia and possibly an SVT, although these were self-limited and Doylene Canning. Ladona Ridgel, M.D., did not feel that these were causing her symptoms.  A  pacemaker was considered, but he did not feel this was indicated yet.  We increased her ACE inhibitor for hypertension.  Her Norvasc was initially stopped on admission.  Doylene Canning. Ladona Ridgel, M.D., feels that possibly it may need to be resumed as an outpatient.  He wants [place] to avoid all AV nodal blocking agents with her trifascicular block.  He will see her in follow-up in a few months in [place], [place].  The [place], office P.A. will see her in a few weeks.  Unless she has significant syncopal episodes or near syncopal episodes, we will continue to follow her medically.  DISCHARGE MEDICATIONS: 1. Coated aspirin once a day. 2. Thorazine 25 mg four times a day as taken at home. 3. Altace 5 mg a day. 4. Nitroglycerin sublingual p.r.n. for chest pain. 5. Novolin Insulin 40 units subcu q.a.m.  LABORATORY DATA:  Sodium 134, potassium 4, BUN 19, creatinine 1.1.  White count 6.9, hemoglobin 12.5, hematocrit 36.6, platelets 226.  The chest x-ray from [facility] showed no active disease. Troponins here were slightly elevated at 0.06.  Her CKs went up to 677 with an MB of 8.4.  Her TSH was 5.18.  INR 1.0.  LDL 74  DISPOSITION:  The patient is discharged home to the care of her assisted living.  Will arrange for follow-up. DD:  05/16/01 TD:  05/16/01 Job: [id number]

## 2011-05-04 NOTE — Discharge Summary (Signed)
NAME:  [name], [name]                      ACCOUNT NO.:  [id number]   MEDICAL RECORD NO.:  [id number]                   PATIENT TYPE:  INP   LOCATION:  3731                                 FACILITY:  [facility]   PHYSICIAN:  Willa Rough, M.D. [facility]              DATE OF BIRTH:  06-20-1925   DATE OF ADMISSION:  11/30/2002  DATE OF DISCHARGE:  12/11/2002                           DISCHARGE SUMMARY - REFERRING   PROCEDURES:  1. Carotid ultrasound December 16.  2. Medtronic pacemaker implantation December 23.   REASON FOR ADMISSION:  Please refer to dictated admission note.   LABORATORY DATA:  Serial CBC's normal.  Cardiac enzymes normal.  Lipid  profile: Total cholesterol 133, triglycerides 77, HDL 64, LDL 54 (ration  2.1).  TSH 4.49.  Urinalysis negative.  Sodium 134 on admission, 129 pre-  discharge.  Renal function normal pre-discharge.   Admission Chest x-ray:  No acute disease.   HOSPITAL COURSE:  The patient presented to the emergency room following  syncopal episode and collapse.  She was sent for a CT scan of the head which  was negative.  She was admitted for further evaluation.  Serial cardiac  enzymes were negative.  She also had trifascicular block on  electrocardiogram which was chronic, and she had a negative  electrophysiology study in May 2002.  She was also noted to have significant  sinus bradycardia with a rate in the 40-50 range on admission.   The patient was admitted for continued monitoring of her heart rate and  further diagnostic evaluation.  Metoprolol was placed on hold at the outset.  There was also concern that she might have some intravascular depletion with  some mild renal insufficiency noted on presentation (creatinine 1.7).  However, no significant orthostatic changes were initially noted.  A repeat  check, however, did find a drop in systolic pressure of approximately 20.   Echocardiography revealed asymmetric septal hypertrophy with no  significant  gradient at rest and no evidence of SAM; normal left ventricular function.   Carotid ultrasounds were also performed revealing no significant bilateral  ICA stenosis.  Of note, the patient did have pronounced murmurs in the left  supraclavicular and infraclavicular region, and there was a suspicion for  significant subclavian artery stenosis on initial presentation.  However, no  obvious evidence of subclavian stenosis was noted by ultrasonography.   Given the finding of HOCM, the renal insufficiency, and mild orthostatic  changes on presentation, decision was also made a the outset to discontinue  hydrochlorothiazide.  Followup renal function did normalize.   The patient was referred to Dr. Sharrell Ku for electrophysiology evaluation  and consideration of pacemaker implantation.  As noted earlier, the patient  did present with a history of chronic trifascicular block and previous  negative electrophysiology study.  Her heart rate apparently did improve off  metoprolol; however, she did develop some transient, asymptomatic second-  degree atrioventricular block off beta  blocker.  Given this in conjunction  with her syncopal presentation and trifascicular block,  recommendation by  Dr. Graciela Husbands was to proceed with permanent pacemaker implantation.   The patient underwent placement of a Medtronic Kappa dual-chamber pacemaker  on December 23 by Dr. Charlies Constable with no noted complications.  Followup  chest x-ray revealed no pneumothorax.  Pacemaker was interrogated and  adjusted by Dr. Graciela Husbands prior to discharge with upper tracking rate set at 120  initially, subsequently decreased to 100 at the time of discharge.   No postoperative complications noted, and the patient was cleared to return  back to [name] in [place] on hospital day #11.   MEDICATION ADJUSTMENTS THIS ADMISSION:  Discontinuation of  hydrochlorothiazide.   DISCHARGE MEDICATIONS:  1. Insulin  70/30, 25 units q.a.m., 10 units q.p.m.  2. Norvasc 100 mg q.d.  3. Thorazine 25 mg q.d.  4. Altace 5 mg q.d.  5. Aspirin 81 mg q.d.  6. Lopressor 50 mg q.d.  7. Protonix 40 mg q.d.  8. Avapro 150 mg b.i.d.   DISCHARGE INSTRUCTIONS:  1. The patient is instructed to raise her arm gradually to her shoulder and     head as outlined by Dr. Graciela Husbands.  2. She is to call if there is any evidence of swelling or bleeding at the     wound site.  3. The patient is to discontinue taking hydrochlorothiazide.  4. The patient is scheduled for initial wound check on Monday, January 12,     at 9:45 a.m. at the [facility].  5. She will follow up with Dr. Sharrell Ku in three months at the [facility]     clinic for pacemaker followup.   DISCHARGE DIAGNOSES:  1. Status post syncope with collapse.     a. Chronic heart fascicular block with development of asymptomatic second-        degree atrioventricular block.     b. Status post Medtronic Kappa (dual-chamber) pacemaker implantation        December 23.  2. Hypertrophic obstructive cardiomyopathy (negative systolic anterior     motion).  3. Prerenal azotemia.  4. Mild orthostatic hypotension.  5. Bilateral carotid bruits with negative carotid Dopplers.  6. History of atypical chest pain with normal adenosine Cardiolite February     2003.  7. History of paroxysmal atrial flutter.  8. Insulin-requiring diabetes mellitus.  9.     Hypertension.  10.      Remote tobacco.  11.      Hyponatremia.     Gene Serpe, P.A. [facility]                      Willa Rough, M.D. [facility]    GS/MEDQ  D:  12/11/2002  T:  12/11/2002  Job:  [id number]   cc:   Annia Friendly. Loleta Chance, M.D.  [address]  [place]  [place] [address]  Fax: [phone]

## 2011-05-04 NOTE — Discharge Summary (Signed)
NAMEELLIANAH, CORDY            ACCOUNT NO.:  [id number]   MEDICAL RECORD NO.:  [id number]          PATIENT TYPE:  INP   LOCATION:  A327                          FACILITY:  [facility]   PHYSICIAN:  Dalia Heading, M.D.  DATE OF BIRTH:  1925-01-15   DATE OF ADMISSION:  07/29/2006  DATE OF DISCHARGE:  08/17/2007LH                                 DISCHARGE SUMMARY   HOSPITAL COURSE SUMMARY:  The patient is a 77 year old black female who  presented to the emergency room with nausea and vomiting.  She was noted on  examination to have an incarcerated periumbilical incisional hernia.  A  surgery consultation was obtained, and the patient subsequently was taken to  the operating room for an incisional herniorrhaphy.  No bowel injury was  noted.  She tolerated the surgery well.  Postoperative course was remarkable  for transient hyperglycemia which was treated with sliding scale insulin.  Diet was advanced out difficulty once her bowel function returned.   The patient is being discharged back to a caregivers home in stable  condition.   DISCHARGE INSTRUCTIONS:  The patient is to follow up Dr. Franky Macho on  August 06, 2006.   DISCHARGE MEDICATIONS:  1. Metoprolol 50 mg p.o. b.i.d.  2. Baby aspirin 1 tablet p.o. q.d.  3. Omeprazole 20 mg p.o. q.d.  4. Norvasc 10 mg p.o. q.d.  5. Lotrel 5-10 one tablet p.o. b.i.d.  6. Altace 5 mg p.o. b.i.d.  7. Thorazine 25 mg p.o. q.d.  8. Tylenol 1 tablet p.o. q.4h. p.r.n. pain.  9. NovoLog 70/30 subcutaneous insulin 25 units q.a.m., 10 units q.p.m.   PRINCIPAL DIAGNOSES:  1. Incarcerated incisional hernia.  2. Insulin-dependent diabetes mellitus.  3. Hypertension.  4. Dementia.   PRINCIPAL PROCEDURE:  Incisional herniorrhaphy for incarceration on July 29, 2006.      Dalia Heading, M.D.  Electronically Signed     MAJ/MEDQ  D:  08/02/2006  T:  08/02/2006  Job:  [id number]   cc:   Tesfaye D. Felecia Shelling, MD  Fax: [phone]

## 2011-05-04 NOTE — Consult Note (Signed)
NAME:  [name], [name]                      ACCOUNT NO.:  [id number]   MEDICAL RECORD NO.:  [id number]                   PATIENT TYPE:  INP   LOCATION:  3731                                 FACILITY:  [facility]   PHYSICIAN:  Doylene Canning. Ladona Ridgel, M.D. [facility]           DATE OF BIRTH:  11/16/1925   DATE OF CONSULTATION:  12/02/2002  DATE OF DISCHARGE:                                   CONSULTATION   REASON FOR CONSULTATION:  The consultation was requested by Dr. Jerral Bonito  for evaluation of syncope.   HISTORY OF PRESENT ILLNESS:  The patient is a very pleasant 77 year old  woman with a history of hypertension and syncope in the past.  She was  apparently admitted back in May of 2002 with a syncopal episode on A-V nodal  blocking drugs. The etiology of her syncope was unknown at that time.  Her A-  V nodal blocking drugs were stopped.  She was noted to have trifascicular  block on her EKG but no bradycardia in the hospital.  She was discharged  home.   With regard to her syncope, she has done well since then until the day of  admission when she was using the bathroom and suddenly, just before she  urinated, passed out.  When she awoke, the paramedics had previously  arrived.  She noted that she felt diaphoretic afterwards.  She has not had  any documented bradycardia or tachycardia in the hospital.   PAST MEDICAL HISTORY:  1. Mild hypertrophic cardiomyopathy with a septum of 17 mm.  There is no     SAM.  2. Reportedly paroxysmal atrial fibrillation.  3. Hypertension.  4. Insulin-dependent diabetes mellitus.  5. History of hyperlipidemia.  6. Her EF by catheterization was 70% with no obstructive coronary disease     noted.   SOCIAL HISTORY:  The patient lives in [place] at a nursing home.  She is  retired from [employer].  She denies tobacco abuse, quitting 15 years  ago.  She denies ethanol abuse.   FAMILY HISTORY:  Notable for her mother dying at 40 of an MI.   REVIEW OF  SYSTEMS:  Recorded by Chinita Pester that there is a history of  dizziness in the past with presyncope.  The rest of her Review of Systems is  negative except for rare atypical chest pain.   PHYSICAL EXAMINATION:  GENERAL:  She is a pleasant, somewhat unkempt-  appearing, elderly woman in no distress.  VITAL SIGNS:  Blood pressure 160/80, pulse 88 and regular, respirations 20.  HEENT:  Normocephalic and atraumatic.  Pupils are equal and round.  The  oropharynx is moist.  NECK:  Bilateral carotid bruits.  The carotids were 2+ and symmetric.  Trachea was midline.  LUNGS:  Clear bilaterally to auscultation.  ABDOMEN:  Soft, nontender, nondistended.  There was no organomegaly.  EXTREMITIES:  No edema.  NEUROLOGIC:  Alert and oriented x 3.  LABORATORY DATA:  CT of the head demonstrated no acute bleed.   Carotid ultrasound demonstrated no stenosis.   EKG demonstrates normal sinus rhythm with trifascicular block.   Cardiac enzymes were negative.   IMPRESSION:  1. Recurrent syncope.  2. Trifascicular block.  3. History of hypertrophic cardiomyopathy (mild with septum of 17 mm, no     systolic anterior motion [SAM])   DISCUSSION:  The etiology of the patient's symptoms are still unclear to me.  At this point, she has risks for both bradycardia and perhaps tachycardia  with hypertrophic cardiomyopathy, although I do not think she has  ventricular tachycardia.  I would recommend insertion of loop recorder.  Alternatively, tilt table testing and/or pacemaker insertion will be other  considerations.  I will discuss with Dr. Graciela Husbands, and we will attempt to  obtain old records which are not available from May of 2002.                                               Doylene Canning. Ladona Ridgel, M.D. [facility]    GWT/MEDQ  D:  12/02/2002  T:  12/03/2002  Job:  [id number]   cc:   Annia Friendly. Loleta Chance, M.D.  [address]  [place]  [place] [address]  Fax: [phone]   Kathrine Cords, R.N. [facility]  [address]  [place], [place]  [address]  Fax: [phone]

## 2011-05-04 NOTE — Discharge Summary (Signed)
[facility]  Patient:    [name], [name] Visit Number: [id number] MRN: [id number]          Service Type: EMS Location: ED Attending Physician:  Hilario Quarry Dictated by:   Guy Franco, P.A. [facility] Admit Date:  01/31/2002 Discharge Date: 02/01/2002   CC:         Dr. Mirna Mires, [place], [place]   Discharge Summary  DISCHARGE DIAGNOSES: 1. Atypical chest pain, resolved. 2. Trifascicular block, status post electrophysiology evaluation in May 2002. 3. Hypertension. 4. Diabetes mellitus, insulin-dependent. 5. Paroxysmal atrial flutter. 6. Status post cholecystectomy. 7. Bilateral carotid bruits with negative carotid Dopplers in May 2002.  HOSPITAL COURSE:  [name] was admitted with atypical chest pain on January 31, 2002, at [facility].  As part of her work-up, she underwent adenosine Cardiolite which revealed no ischemia with EF of 70%.  Lab studies revealed a sodium of 140, potassium 3.5, BUN 14 and creatinine 1.1.  Cardiac isoenzymes were negative.  Her hemoglobin A1C was 8.6.  Lipid profile with total cholesterol 145, triglycerides 44, HDL 76 and LDL 60.  TSH was 5.089. Her chest pain was felt not to be cardiac in origin.  During the rest of her hospitalization, she was noted to have some trifascicular block; however, this has been evaluated with EP in March 2002. In addition, her diabetes/blood glucoses were very erratic and she was covered with sliding scale insulin during her hospital stay in addition to her regular 70/30 insulin doses.  DISPOSITION:  Discharged to home on February 04, 2002.  DISCHARGE MEDICATIONS: 1. Altace 5 mg one p.o. b.i.d. 2. Thorazine 25 mg q.i.d. 3. Enteric coated aspirin 325 mg once a day. 4. Norvasc 10 mg once a day. 5. Diovan 80/12.5 mg once a day. 6. Insulin 70/30 five units in the evening, seven units in the morning. 7. Tylenol regular strength one to two tablets every six hours as  needed for    pain.  Essentially, her medications are the same with the exception of Norvasc which has been increased from 5 mg to 10 mg once a day.  ACTIVITY:  No strenuous activity.  SPECIAL INSTRUCTIONS:  Call with any concerns or questions.  FOLLOWUP:  She needs to follow up with Dr. Valera Castle in two weeks and I will make that appointment prior to her leaving today.  In addition, she needs to follow up with Dr. Loleta Chance within the next several weeks to have her diabetes evaluated. Dictated by:   Guy Franco, P.A. [facility] Attending Physician:  Hilario Quarry DD:  02/04/02 TD:  02/04/02 Job: [id number]

## 2011-05-04 NOTE — H&P (Signed)
NAME:  [name], [name]                      ACCOUNT NO.:  [id number]   MEDICAL RECORD NO.:  [id number]                   PATIENT TYPE:  INP   LOCATION:  1829                                 FACILITY:  [facility]   PHYSICIAN:  Willa Rough, M.D. [facility]              DATE OF BIRTH:  [birth year]   DATE OF ADMISSION:  11/30/2002  DATE OF DISCHARGE:                                HISTORY & PHYSICAL   HISTORY OF PRESENT ILLNESS:  The patient had a syncopal episode today.  She  lives at [facility], phone number [phone].  She is a spry and  alert 77 year old.  The patient has known coronary disease.  She does have a  history of trifascicular block.  I do not have the exact records, but it is  my understanding that she had an EP study in May of 2002.  I presumed it was  to measure her HV interval.  There was no intervention done from the EP view  point.   The patient has had some atypical chest pain.  She had a Cardiolite in  February of 2003 with an ejection fraction of 70% and no ischemia.  There is  no known documentation of hypertrophic obstructive cardiomyopathy that I am  aware of.  Today, the patient was feeling relatively well.  She has not had  any significant symptoms.  In particular, her p.o. intake has been adequate  and she has not had diarrhea.  She was standing in her bathroom.  She had  not had any type of urine output or bowel movement.  She suddenly collapsed  with no significant warning and found herself on the floor.  There was no  bladder or bowel incontinence, it was brief duration, and she was  transported to [facility].   Here in the emergency room, head CT showed no active bleed and her EKG  showed no significant change and she is now admitted by [place].  She denies any  particular palpitations.  She took all of her usual medications today.   ALLERGIES:  None known.   MEDICATIONS:  1. Norvasc 10 mg q.d.  2. Thorazine 25 mg q.d.  3. Altace 5 mg  b.i.d.  4. Aspirin 81 mg q.d.  5. Lopressor 50 mg b.i.d.  6. Protonix 40 mg q.d.  7. Hydrochlorothiazide 12.5 mg q.d.  8. Avapro 150 mg b.i.d.  9. Insulin.  It is of note that here her sugar was 80 and there is no     documentation that this was a hypoglycemic episode.   PAST MEDICAL HISTORY:  Other medical problems have included paroxysm atrial  fibrillation or flutter, diabetes, hypertension, carotid bruits with  negative dopplers in the past, history of cholecystectomy.   SOCIAL HISTORY:  The patient lives at [facility].  She is  widowed.  She is retired from [employer].  She does have daughters  available.  She uses a walker.  She has not smoked for 15 years and she does  not drink alcohol.   FAMILY HISTORY:  Mother died in her 43s of a MI.   REVIEW OF SYMPTOMS:  The patient has not had any significant fevers or  chills.  She has no major HEENT problems.  She has had some slight chest  pain intermittently.  She has no significant GU symptoms or GI symptoms.  She has had no major musculoskeletal complaints.  The remainder of her  review of systems is negative.   PHYSICAL EXAMINATION:  VITAL SIGNS:  Temperature is 97.6, pulse is 51,  respirations are 20 to 25, blood pressure initially was 134/36.  We then did  orthostatic blood pressure check in the emergency room.  The blood pressure  was 169/66 with a pulse of 54 while lying.  Sitting it was 146/61 with a  pulse of 60 and standing her pressure was 152/46 with a pulse of 68.  This  does show some slight drop but I am not convinced that this is major  orthostasis but it will be kept in mind.  GENERAL:  She appeared to be in no distress.  HEENT:  No marked abnormalities.  NECK:  A bruit that is heard in the left neck; however, in addition, it is  also heard in the left supraclavicular area and also below the clavicle.  It  is on the left and also a soft bruit is also heard in the right  supraclavicular  area.  LUNGS:  Question of a few basilar dry crackles.  There were no major skin  lesions.  CARDIOVASCULAR:  S1 with a S2.  There is a soft systolic murmur heard.  ABDOMEN:  Soft.  She had no significant abdominal bruits.  GENITOURINARY:  Deferred.  RECTAL:  Deferred.  EXTREMITIES:  Question of decrease pulse in the left radial.  The pulses are  adequate.  MUSCULOSKELETAL:  There are no obvious musculoskeletal deformities.  NEUROLOGICAL:  Grossly intact at this time.   LABORATORY DATA:  Hemoglobin is 12.9 and platelets are 217,000.  BUN is 26  and creatinine 1.7.  This is definitely increased from the last time we had  it recorded in February of 2003; at which time, the BUN was in the 15 range  and the creatinine in the 1.1 range.  Glucose checked here was 80.   EKG shows her old right bundle branch block with left axis and first degree  AV block.  There is sinus bradycardia.  Chest x-ray shows cardiac  enlargement with no active disease.  She did have a head CT in the emergency  room showing some mild atrophy with small vessel disease.  There were some  remote left lacunar findings.  Her first troponin is 0.02.   IMPRESSION:  1. Syncope with collapse:  This was a very sudden episode.  It would seem     that high consideration has to be a cardiac arrhythmia.  We have no     documentation of tachyarrhythmias in the past.  With her chronic     trifascicular block, she may well have had a high degree arteriovenous     block.  As mentioned before, she had an electrophysiology study in May of     2002 that I am told is negative but I do not have the specifics.  Other     possibilities in this situation would have to include whether or not she  has carotid or substantial subclavian disease.  It is of note that she     has had a bruit in the past and the dopplers did not show high grade    disease but they need to be repeated.  The patient may well need a     pacemaker.  2. Chronic  trifascicular block with right bundle, left axis and first degree     arteriovenous block.  3. Current sinus bradycardia.  4. History of atypical chest pain with a normal Cardiolite earlier this     year.  5. History of paroxysm atrial fibrillation or flutter:  I guess it is     possible that she could have had a burst of this followed by a pause when     she converted back to sinus but we have not documented this.  6. Diabetes on medications.  7. Hypertension on medications.  8. Renal:  As mentioned above, BUN and creatinine are higher than baseline.     It is possible that this could be due to her volume status.  She is on     hydrochlorothiazide.  There is no definite evidence of decreased oral     intake recently.  Other possibilities could be that this is related to     intrinsic renal disease or all of her medications.  We will hold her     hydrochlorothiazide and hydrate her slightly and follow her carefully.  9. Left subclavian, right subclavian, and left carotid bruits:  She     definitely needs follow-up dopplers:  The patient's metoprolol will be     held.  We will hold her hydrochlorothiazide.  We will give her a small     amount of fluid.  We will repeat her carotid and subclavian dopplers and     follow her carefully.                                               Willa Rough, M.D. [facility]    JK/MEDQ  D:  11/30/2002  T:  11/30/2002  Job:  [id number]   cc:   Annia Friendly. Loleta Chance, M.D.  [address]  [place]  [place] [address]  Fax: [phone]   Jesse Sans. Wall, M.D. [facility]  [address]  [place]  [place] [address]  Fax: [phone]

## 2011-05-04 NOTE — Op Note (Signed)
[facility]  Patient:    [name], [name] Visit Number: [id number] MRN: [id number]          Service Type: MED Location: 3A [phone] Attending Physician:  Hilario Quarry Dictated by:   Darreld Mclean, M.D. Proc. Date: 04/26/02 Admit Date:  04/22/2002   CC:         Syliva Overman, M.D.   Operative Report  PREOPERATIVE DIAGNOSIS:  Subcapital fracture of the left hip.  POSTOPERATIVE DIAGNOSIS:  Subcapital fracture of the left hip.  PROCEDURE:  Bipolar hip prosthesis on the left with a #9 stem, zero neck, 47 head.  DRAINS:  One large Hemovac drain used.  SURGEON:  Darreld Mclean, M.D.  ASSISTANT:  Candace Cruise, P.A.-C.  ESTIMATED BLOOD LOSS:  200 cc none replaced.  INDICATIONS FOR PROCEDURE:  The patient is a 77 year old female who fell and injured her hip on Wednesday. Today is Sunday. She was really scheduled for surgery on Thursday but when brought to the operating room area, her blood sugars were high and there were cardiac irregularities. She was seen by cardiology. She was rescheduled the following day. This past Friday, two days ago, she was brought to the OR but her temperature was 101, surgery was postponed until today. Her temperature today is afebrile. The patient is at increased risk secondary to general medical condition but she is stable now. There are no further cardiac abnormalities. The risks and imponderables have been discussed with the patient preoperatively.  INDICATIONS FOR PHYSICIAN ASSISTANT:  We are a small community hospital without house staff. I am the only orthopedic surgeon on staff. Very limited to availability of surgeons to assist. A physician assistant is necessary and indicated. Also, she helped with the insertion of the prosthesis, helped with the actual surgery by placing sutures and providing the necessary assistance a nursing assistant would normally provide. Her services were professionally indicated  and necessary.  DESCRIPTION OF PROCEDURE:  The patient was given a spinal anesthesia and transferred to the operating table right side down left side up decubitus position and held by bolsters. The patient was prepped and draped in the usual manner. A posterior approach was made to the hip. The sciatic nerve was identified and protected with Penrose drain. Short external rotators were identified, tagged and cut. The assistant tagged and cut these. The hip capsule was open. The assistant removed the femoral head with a cork screw. The femoral head measured 47 mm. The neck of the femur was then prepared first with a box osteotome and then reamed up to a size 9. The femoral neck was cut and then it was rasped up to a size 9. The neck was prepared. Trial reduction was carried out with a 9 stem, zero neck, 47 head and fit nicely and the leg lengths appeared to be equal and were stable. The prosthesis was removed. The permanent prosthesis, #9 stem, zero neck, and 47 head was then inserted. X-rays were taken and this looked good. Short external rotators were approximated in the previously applied tags. The Penrose drain removed. No apparent injury to the nerve. The Hemovac drain placed and sewn with 2-0 silk. The fascia layer was reapproximated using interrupted #1 Bralon suture. The subcutaneous tissue was closed in layers using 2-0 plain, skin reapproximated with skin staples. The patient tolerated the procedure well. She will go to the intensive care unit for reaction as the recovery room is closed on weekends and today is a Sunday. The patient tolerated the  procedure well. Dictated by:   Darreld Mclean, M.D. Attending Physician:  Hilario Quarry DD:  04/26/02 TD:  04/28/02 Job: [id number]

## 2011-05-04 NOTE — Discharge Summary (Signed)
[facility]  Patient:    [name], [name] Visit Number: [id number] MRN: [id number]          Service Type: MED Location: 3A [phone] Attending Physician:  Hilario Quarry Dictated by:   Darreld Mclean, M.D. Admit Date:  04/22/2002                             Discharge Summary  DISCHARGE DIAGNOSES: 1. Subcapital fracture of the left hip. 2. Hypertension. 3. History of chest pain.  PROCEDURE:  Bipolar hip replacement on the left.  CONDITION ON DISCHARGE:  Improved.  Prognosis good.  DISPOSITION:  Transferred to [facility].  ACTIVITY:  Weightbearing as tolerated on left hip.  Use a walker.  WOUND CARE:  Remove sutures from the left hip on May 24 or May 25. Steri-Strips to wound.  FOLLOWUP:  Return to the office in one month on June 13, at 10:10 a.m.  I could see the patient in the nursing home if x-rays were done previously of the left hip.  Fax Dr. Hilda Lias the report and I will see her in the nursing home.  HISTORY OF PRESENT ILLNESS:  The patient fell and sustained the above-mentioned injury.  She was seen and evaluated.  The patient has a prior history of cardiac disease and was seen by Dr. Loleta Chance and by cardiology.  It was felt that the patient could undergo the surgical procedure.  HOSPITAL COURSE:  The patient was brought to the operating room area on the day after admission.  She was prepared for surgery, however, her blood pressure and blood sugars were high and she vomited coffee-grounds material. The procedure was then postponed and she was seen by GI on May 9.  She was fully evaluated for her problem and had an EGD.  Cardiology also saw the patient.  EGD just showed a small area with no definitive area of bleeding. They felt it was due to esophagogastritis with no evidence of an active bleed. Cultures were obtained for H. pylori.  The patient was then brought back to surgery later on the day of May 9,  but she had a marked temperature of 101-102.  Surgery was again postponed.  She was rescheduled for surgery on May 11.  At this time, she was at bed rest.  On May 11, she had the bipolar hip done and tolerated it very well.  On May 12, postop day #1, labs were within normal limits except for slightly elevated glucose.  Physical therapy was begun.  The patient has made very slow progress with PT.  On postop day #2, the Hemovac was removed.  On postop day #3, her hemoglobin had dropped and the patient was transfused two units of blood.  By postop day #4, the patient is doing well.  The wound looks good.  Hemoglobin is 11.9.  Plans were to transfer to the nursing home.  Physical therapy orders are stated above.  DISCHARGE MEDICATIONS:  1. Enteric coated aspirin a day.  2. Chlorpromazine 25 mg tablet q.i.d.  3. Metoprolol 50 mg tablet b.i.d.  4. Pantoprazole 40 mg tablet p.o. q.d.  5. Hydrochlorothiazide 1/2 of a 25 mg tablet p.o. b.i.d.  6. Irbesartan 150 mg tablet p.o. b.i.d.  7. Ramipril 2.5 mg tablet two p.o. b.i.d.  8. Sliding scale insulin.  Contact Dr. Loleta Chance for the correct doses.  9. Amlodipine 5 mg tablet, 10 mg p.o. q.d.  10. Darvocet-N 100 one every six hours p.r.n. pain. 11. Ambien 5 mg p.o. q.h.s. p.r.n. sleep. 12. Phenergan 12.5 mg IM q.4h. p.r.n. nausea and vomiting. 13. Tylenol 10 grains q.4h. p.r.n. for temperature greater than 101. Dictated by:   Darreld Mclean, M.D. Attending Physician:  Hilario Quarry DD:  04/30/02 TD:  04/30/02 Job: [id number]

## 2011-05-22 ENCOUNTER — Ambulatory Visit (INDEPENDENT_AMBULATORY_CARE_PROVIDER_SITE_OTHER): Payer: Medicare Other | Admitting: Internal Medicine

## 2011-05-22 ENCOUNTER — Encounter: Payer: Self-pay | Admitting: Internal Medicine

## 2011-05-22 VITALS — BP 117/61 | HR 58 | Wt 150.0 lb

## 2011-05-22 DIAGNOSIS — I498 Other specified cardiac arrhythmias: Secondary | ICD-10-CM

## 2011-05-22 DIAGNOSIS — I442 Atrioventricular block, complete: Secondary | ICD-10-CM

## 2011-05-22 DIAGNOSIS — Z950 Presence of cardiac pacemaker: Secondary | ICD-10-CM

## 2011-05-22 DIAGNOSIS — R110 Nausea: Secondary | ICD-10-CM | POA: Insufficient documentation

## 2011-05-22 DIAGNOSIS — R634 Abnormal weight loss: Secondary | ICD-10-CM

## 2011-05-22 DIAGNOSIS — R001 Bradycardia, unspecified: Secondary | ICD-10-CM

## 2011-05-22 NOTE — Progress Notes (Signed)
HPI [name] returns today for followup. She is a pleasant 77 year old woman with a history of symptomatic bradycardia status post pacemaker insertion. She has hypertension. Over the last several months, she has begun to lose weight. Her appetite is reduced. She has had nausea and vomiting. She denies chest pain, fevers, chills, or diarrhea. No syncope. Not on File   Current Outpatient Prescriptions  Medication Sig Dispense Refill  . acetaminophen (TYLENOL) 325 MG tablet Take 650 mg by mouth every 6 (six) hours as needed.        Marland Kitchen allopurinol (ZYLOPRIM) 100 MG tablet Take 100 mg by mouth daily.        Marland Kitchen amLODipine (NORVASC) 10 MG tablet Take 10 mg by mouth daily.        Marland Kitchen aspirin 81 MG tablet Take 81 mg by mouth daily.        Marland Kitchen atorvastatin (LIPITOR) 40 MG tablet Take 20 mg by mouth.       . benazepril (LOTENSIN) 20 MG tablet Take 10 mg by mouth daily.       Marland Kitchen donepezil (ARICEPT) 5 MG tablet Take 5 mg by mouth at bedtime as needed.        . ergocalciferol (VITAMIN D2) 50000 UNITS capsule Take 50,000 Units by mouth once a week.        . insulin glargine (LANTUS) 100 UNIT/ML injection Inject 10 Units into the skin at bedtime.        . insulin NPH-insulin regular (NOVOLIN 70/30) (70-30) 100 UNIT/ML injection Inject 25 Units into the skin daily with breakfast. 10 units bid       . levothyroxine (SYNTHROID, LEVOTHROID) 50 MCG tablet Take 50 mcg by mouth daily.        Marland Kitchen LORazepam (ATIVAN) 0.5 MG tablet Take 0.5 mg by mouth every 8 (eight) hours.        . metoprolol (TOPROL-XL) 50 MG 24 hr tablet Take 50 mg by mouth daily.        Marland Kitchen omeprazole (PRILOSEC) 20 MG capsule Take 20 mg by mouth daily.        . Thiamine HCl (VITAMIN B-1) 100 MG tablet Take 100 mg by mouth daily.        . vitamin B-12 (CYANOCOBALAMIN) 100 MCG tablet Take 50 mcg by mouth daily.        Marland Kitchen DISCONTD: furosemide (LASIX) 20 MG tablet Take 20 mg by mouth 2 (two) times daily.        Marland Kitchen DISCONTD: Levothyroxine Sodium 150 MCG CAPS  Take 1 capsule by mouth daily.          Past Medical History  Diagnosis Date  . Diabetes mellitus     insulin dependant  . Hypertension   . Hyperlipidemia   . Paroxysmal atrial fibrillation   . GERD (gastroesophageal reflux disease)   . Dementia   . Cardiac pacemaker 2003    implant    ROS:   All systems reviewed and negative except as noted in the HPI.   Past Surgical History  Procedure Date  . Pacemaker insertion   . Appendectomy      No family history on file.   History   Social History  . Marital Status: Widowed    Spouse Name: N/A    Number of Children: N/A  . Years of Education: N/A   Occupational History  . retired     living in assisted living   Social History Main Topics  . Smoking status: Never Smoker   .  Smokeless tobacco: Never Used  . Alcohol Use: No  . Drug Use: No  . Sexually Active: Not on file   Other Topics Concern  . Not on file   Social History Narrative  . No narrative on file     BP 117/61  Pulse 58  Wt 150 lb (68.04 kg)  Physical Exam:  Well appearing NAD HEENT: Unremarkable Neck:  No JVD, no thyromegally Lymphatics:  No adenopathy Back:  No CVA tenderness Lungs:  Clear. Well-healed pacemaker incision. HEART:  Regular rate rhythm, no murmurs, no rubs, no clicks Abd:  positive bowel sounds, no organomegally, no rebound, no guarding Ext:  2 plus pulses, no edema, no cyanosis, no clubbing Skin:  No rashes no nodules Neuro:  CN II through XII intact, motor grossly intact  DEVICE  Normal device function.  See PaceArt for details.   Assess/Plan:

## 2011-05-22 NOTE — Patient Instructions (Signed)
Your physician recommends that you schedule a follow-up appointment in: MARCH 2013 WITH DR.TAYLOR

## 2011-05-22 NOTE — Assessment & Plan Note (Signed)
Her device is working normally. Will recheck in several months. 

## 2011-05-22 NOTE — Assessment & Plan Note (Signed)
The etiology is unclear. I will refer her back to her primary physician.

## 2011-05-22 NOTE — Assessment & Plan Note (Signed)
This is resolved status post pacemaker incision. Her current device is working normally.

## 2011-05-22 NOTE — Assessment & Plan Note (Signed)
I suspect this is related to the nausea. She notes that she is not eating as much. She may require gastrointestinal followup.

## 2011-06-17 DIAGNOSIS — K3184 Gastroparesis: Secondary | ICD-10-CM

## 2011-06-17 HISTORY — DX: Gastroparesis: K31.84

## 2011-06-25 IMAGING — CR DG ABDOMEN ACUTE W/ 1V CHEST
3 series · 3 of 3 positions shown · non-contrast
Comparison: 07/29/2006

CLINICAL DATA: Vomiting/constipation

ACUTE ABDOMEN SERIES (ABDOMEN 2 VIEW & CHEST 1 VIEW)

[view not recorded (1 of 3)]
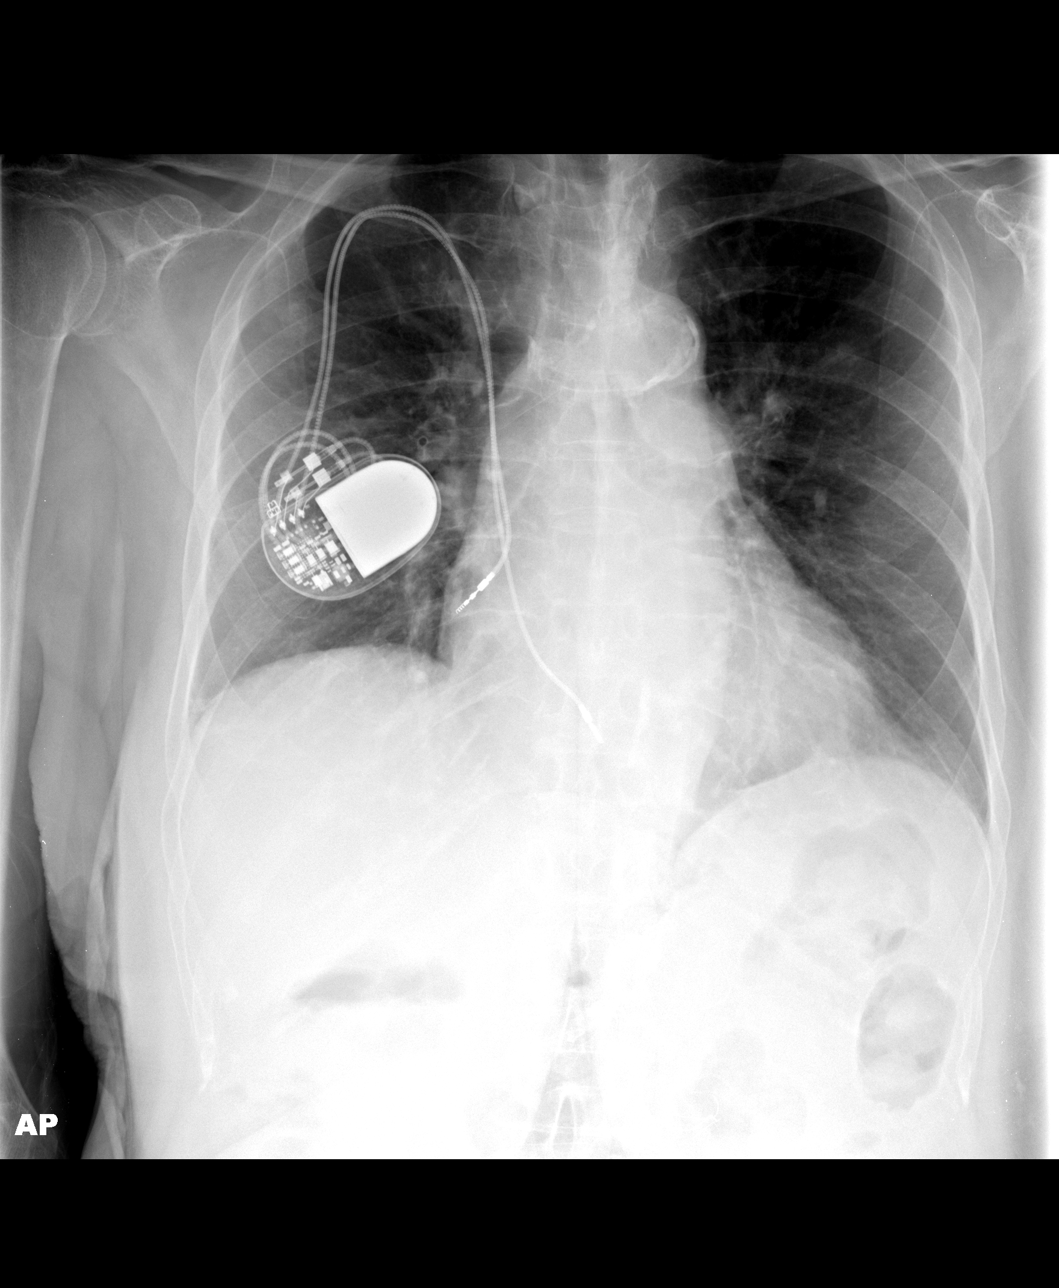

[view not recorded (2 of 3)]
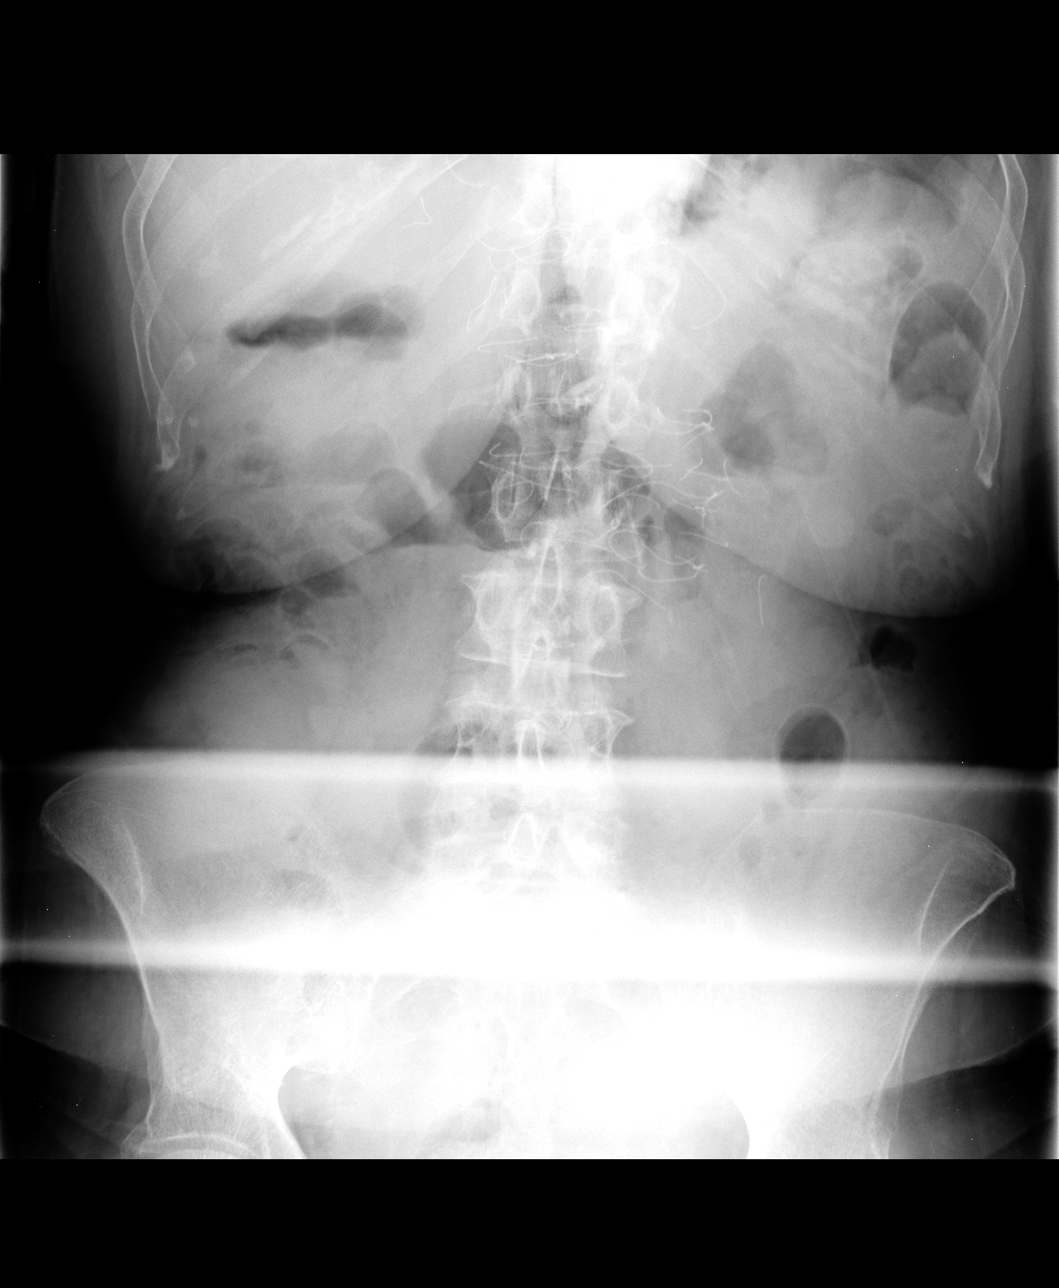

[view not recorded (3 of 3)]
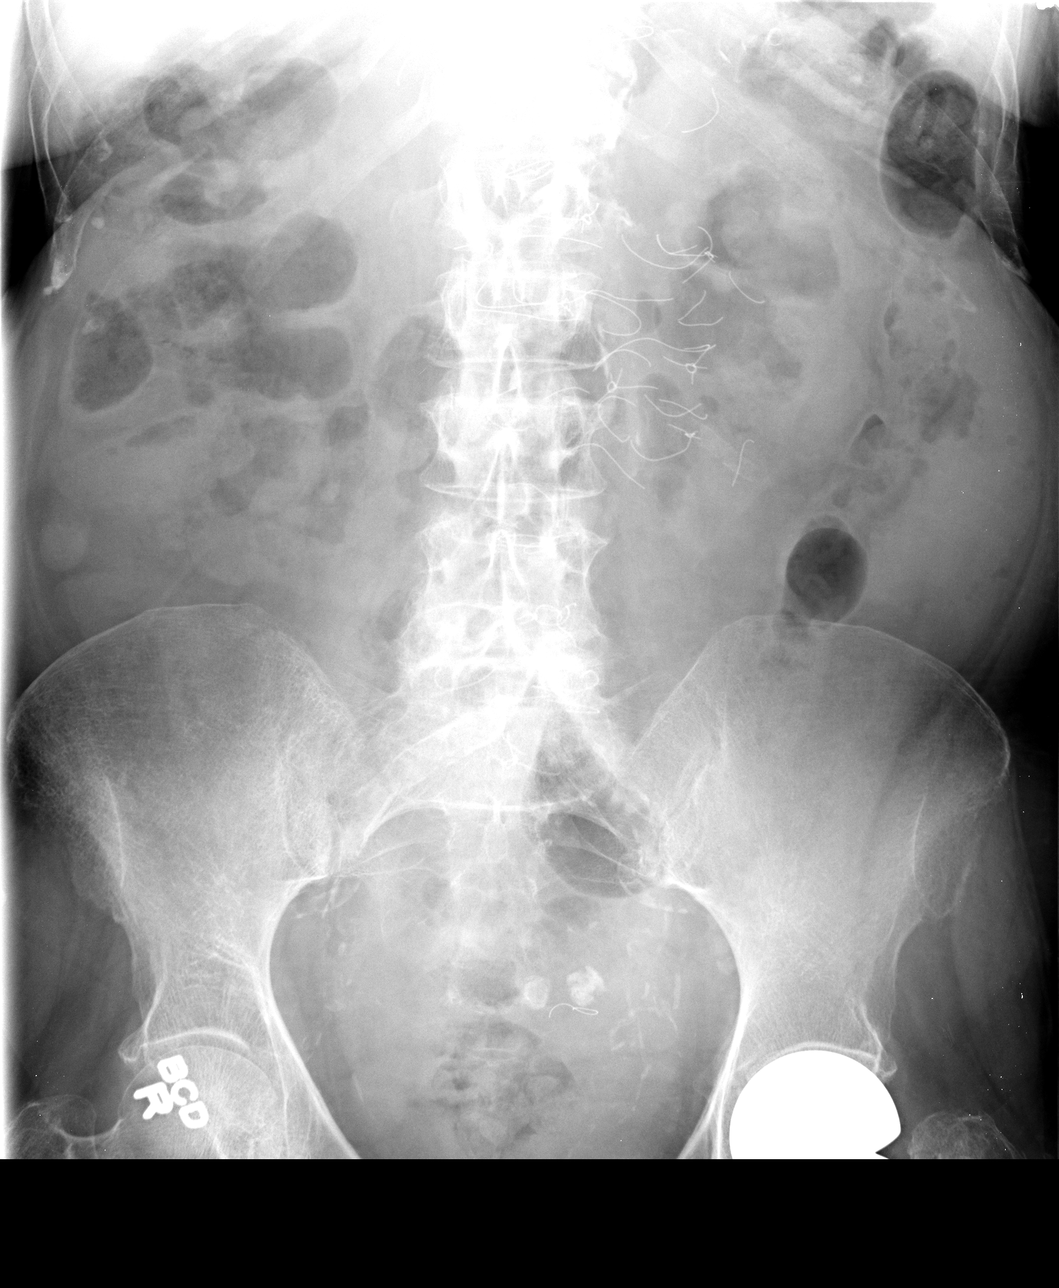

[3 of 3 positions shown; findings below may reference images not displayed]

FINDINGS: Heart size slightly exceeds normal variation.  Dual lead
permanent cardiac pacer as before.  Heavy aortic calcification.  No
congestive heart failure or active disease in one-view.

No free air or acute/specific abnormality of the bowel gas pattern.
No suggestion of fecal impaction or significant constipation.
Postsurgical changes noted with sutures and staples in the
abdominal cavity.  The prior left hip arthroplasty. There is
aortoiliac calcification. There are calcified uterine fibroid
lesions, but no other pathological calcifications.
IMPRESSION: 1.  Mild cardiomegaly with dual lead pacer - no congestive heart
failure or active disease in one-view.
2.  No acute or specific abdominal findings. There are calcified
uterine fibroids.

## 2011-06-27 ENCOUNTER — Encounter: Payer: Self-pay | Admitting: Gastroenterology

## 2011-06-27 ENCOUNTER — Ambulatory Visit (INDEPENDENT_AMBULATORY_CARE_PROVIDER_SITE_OTHER): Payer: Medicare Other | Admitting: Gastroenterology

## 2011-06-27 DIAGNOSIS — R1013 Epigastric pain: Secondary | ICD-10-CM | POA: Insufficient documentation

## 2011-06-27 DIAGNOSIS — R110 Nausea: Secondary | ICD-10-CM

## 2011-06-27 DIAGNOSIS — R634 Abnormal weight loss: Secondary | ICD-10-CM

## 2011-06-27 MED ORDER — OMEPRAZOLE 20 MG PO CPDR: 20.0000 mg | DELAYED_RELEASE_CAPSULE | Freq: Every day | ORAL | Status: DC

## 2011-06-27 NOTE — Progress Notes (Signed)
Primary Care Physician:  Milana Obey, MD  Primary Gastroenterologist:  Jonette Eva, MD  Chief Complaint  Patient presents with  . Weight Loss    HPI:  [name] is a 77 y.o. female here at the request of Dr. Sudie Bailey for further evaluation of weight loss, vomiting. She presents with her caregiver from [facility]. She has been a resident there for nearly 15 years. Weight usually 180. Last year started to be picky eater. Really having to encourage patient to eat. December 2011, Weight 170 at [facility]. March, pacemaker revised. Started to have lots of nausea and weight loss progressive. Recently switched from Dr. Janna Arch to Dr. Sudie Bailey. Chronic DM, Hgb 7. Friday, last time with dry heaves. No abdominal pain. BM regular. Sometimes stools hard, has rectal burning. No melena, brbpr. PRN stool softners/laxative. Denies dysphagia or odynophagia. Symptoms seem to get worse after having her pacemaker revised in March of this year.   Current Outpatient Prescriptions  Medication Sig Dispense Refill  . acetaminophen (TYLENOL) 325 MG tablet Take 650 mg by mouth every 6 (six) hours as needed.        Marland Kitchen allopurinol (ZYLOPRIM) 100 MG tablet Take 100 mg by mouth daily.        Marland Kitchen amLODipine (NORVASC) 10 MG tablet Take 10 mg by mouth daily.        Marland Kitchen aspirin 81 MG tablet Take 81 mg by mouth daily.        Marland Kitchen atorvastatin (LIPITOR) 40 MG tablet Take 20 mg by mouth.       Marland Kitchen b complex vitamins tablet Take 1 tablet by mouth daily.        . benazepril (LOTENSIN) 20 MG tablet Take 10 mg by mouth daily.       Marland Kitchen donepezil (ARICEPT) 5 MG tablet Take 5 mg by mouth at bedtime as needed.        . insulin NPH-insulin regular (NOVOLIN 70/30) (70-30) 100 UNIT/ML injection Inject 25 Units into the skin daily with breakfast. 10 units bid       . levothyroxine (SYNTHROID, LEVOTHROID) 50 MCG tablet Take 50 mcg by mouth daily.        . metoprolol (TOPROL-XL) 50 MG 24 hr tablet Take 50 mg by mouth daily.         Marland Kitchen UNKNOWN TO PATIENT Vitamin D  50000 units  Once weekly       . ergocalciferol (VITAMIN D2) 50000 UNITS capsule Take 50,000 Units by mouth once a week.        . insulin glargine (LANTUS) 100 UNIT/ML injection Inject 10 Units into the skin at bedtime.        Marland Kitchen LORazepam (ATIVAN) 0.5 MG tablet Take 0.5 mg by mouth every 8 (eight) hours. As needed      . omeprazole (PRILOSEC) 20 MG capsule Take 1 capsule (20 mg total) by mouth daily before breakfast.  30 capsule  11  . Thiamine HCl (VITAMIN B-1) 100 MG tablet Take 100 mg by mouth daily.        . vitamin B-12 (CYANOCOBALAMIN) 100 MCG tablet Take 50 mcg by mouth daily.          Allergies as of 06/27/2011  . (No Known Allergies)    Past Medical History  Diagnosis Date  . Diabetes mellitus     insulin dependant  . Hypertension   . Hyperlipidemia   . Paroxysmal atrial fibrillation   . GERD (gastroesophageal reflux disease)   . Dementia   .  Cardiac pacemaker 2003, revised 2012    implant  . Hypothyroidism     Past Surgical History  Procedure Date  . Pacemaker insertion   . Appendectomy   . Hernia repair 2007    incarcerated  . Hip fracture surgery 2003    left    Family History  Problem Relation Age of Onset  . Diabetes Father   . Colon cancer Neg Hx     History   Social History  . Marital Status: Widowed    Spouse Name: N/A    Number of Children: N/A  . Years of Education: N/A   Occupational History  . retired     living in assisted living, [place]  .      retired [employer]   Social History Main Topics  . Smoking status: Former Games developer  . Smokeless tobacco: Never Used   Comment: Quit 20 plus years  . Alcohol Use: No  . Drug Use: No  . Sexually Active: Not on file   Other Topics Concern  . Not on file   Social History Narrative  . No narrative on file      ROS:  General: Negative for fever, chills, fatigue, weakness. See HPI. Eyes: Negative for vision changes.  ENT: Negative  for hoarseness, difficulty swallowing , nasal congestion. CV: Negative for chest pain, angina, palpitations, dyspnea on exertion, peripheral edema.  Respiratory: Negative for dyspnea at rest, dyspnea on exertion, cough, sputum, wheezing.  GI: See history of present illness. GU:  Negative for dysuria, hematuria, urinary incontinence, urinary frequency, nocturnal urination.  MS: Negative for joint pain, low back pain.  Derm: Negative for rash or itching.  Neuro: Negative for weakness, abnormal sensation, seizure, frequent headaches, memory loss, confusion. Patient has h/o dementia but overall doing well per caregiver. No children. Sister sometimes signs consents but patient does not have Midwife. Psych: Negative for anxiety, depression, suicidal ideation, hallucinations.  Endo: See HPI.  Heme: Negative for bruising or bleeding. Allergy: Negative for rash or hives.    Physical Examination:  BP 124/66  Pulse 79  Temp(Src) 96.9 F (36.1 C) (Tympanic)  Ht 5\' 5"  (1.651 m)  Wt 146 lb 9.6 oz (66.497 kg)  BMI 24.40 kg/m2   General: Well-nourished, well-developed in no acute distress.  Head: Normocephalic, atraumatic.   Eyes: Conjunctiva pink, no icterus. Mouth: Oropharyngeal mucosa moist and pink , no lesions erythema or exudate. Neck: Supple without thyromegaly, masses, or lymphadenopathy.  Lungs: Clear to auscultation bilaterally.  Heart: Regular rate and rhythm, no murmurs rubs or gallops.  Abdomen: Bowel sounds are normal, nontender, nondistended, no hepatosplenomegaly or masses, no abdominal bruits or hernia , no rebound or guarding.   Extremities: No lower extremity edema.  Neuro: Alert and oriented x 4 , grossly normal neurologically.  Skin: Warm and dry, no rash or jaundice.   Psych: Alert and cooperative, normal mood and affect.

## 2011-06-27 NOTE — Assessment & Plan Note (Addendum)
Unintentional weight loss associated with anorexia and dry heaves. On 02/17/2011 during hospitalization she weighed 175 pounds. At [facility] assisted living she weighed 170 pounds in December 2011. Current weight 146 pounds. Progressive difficulty encouraging patient to eat. She does have some dementia but overall does well according to the caregiver. No behavioral issues. She does have epigastric tenderness on exam. She has dry heaves. Recommend rule out upper GI tract etiology. EGD in the near future. I have discussed the risks, alternatives, benefits with regards to but not limited to the risk of reaction to medication, bleeding, infection, perforation and the patient is agreeable to proceed. Written consent to be obtained.  Omeprazole 20 mg daily.

## 2011-06-27 NOTE — Progress Notes (Signed)
Cc to PCP 

## 2011-06-27 NOTE — Assessment & Plan Note (Signed)
Referred to weight loss assessment and plan.

## 2011-06-27 NOTE — Assessment & Plan Note (Signed)
Referred to epigastric pain assessment and plan.

## 2011-07-06 MED ORDER — SODIUM CHLORIDE 0.45 % IV SOLN
Freq: Once | INTRAVENOUS | Status: AC
  Administered 2011-07-09: 08:00:00 via INTRAVENOUS

## 2011-07-09 ENCOUNTER — Encounter: Payer: Medicare Other | Admitting: Gastroenterology

## 2011-07-09 ENCOUNTER — Encounter (HOSPITAL_COMMUNITY)
Admission: RE | Disposition: A | Discharge status: Home Health | Payer: Self-pay | Source: Ambulatory Visit | Attending: Gastroenterology

## 2011-07-09 ENCOUNTER — Other Ambulatory Visit: Payer: Self-pay | Admitting: Gastroenterology

## 2011-07-09 ENCOUNTER — Ambulatory Visit (HOSPITAL_COMMUNITY)
Admission: RE | Admit: 2011-07-09 | Discharge: 2011-07-09 | Disposition: A | Discharge status: Home Health | Payer: Medicare Other | Source: Ambulatory Visit | Attending: Gastroenterology | Admitting: Gastroenterology

## 2011-07-09 DIAGNOSIS — K222 Esophageal obstruction: Secondary | ICD-10-CM | POA: Insufficient documentation

## 2011-07-09 DIAGNOSIS — R634 Abnormal weight loss: Secondary | ICD-10-CM

## 2011-07-09 DIAGNOSIS — R110 Nausea: Secondary | ICD-10-CM | POA: Insufficient documentation

## 2011-07-09 DIAGNOSIS — E119 Type 2 diabetes mellitus without complications: Secondary | ICD-10-CM | POA: Insufficient documentation

## 2011-07-09 DIAGNOSIS — R1110 Vomiting, unspecified: Secondary | ICD-10-CM

## 2011-07-09 DIAGNOSIS — R1013 Epigastric pain: Secondary | ICD-10-CM | POA: Insufficient documentation

## 2011-07-09 DIAGNOSIS — Z01812 Encounter for preprocedural laboratory examination: Secondary | ICD-10-CM | POA: Insufficient documentation

## 2011-07-09 DIAGNOSIS — K2940 Chronic atrophic gastritis without bleeding: Secondary | ICD-10-CM | POA: Insufficient documentation

## 2011-07-09 DIAGNOSIS — Z79899 Other long term (current) drug therapy: Secondary | ICD-10-CM | POA: Insufficient documentation

## 2011-07-09 DIAGNOSIS — K449 Diaphragmatic hernia without obstruction or gangrene: Secondary | ICD-10-CM | POA: Insufficient documentation

## 2011-07-09 DIAGNOSIS — K2970 Gastritis, unspecified, without bleeding: Secondary | ICD-10-CM

## 2011-07-09 DIAGNOSIS — Z7982 Long term (current) use of aspirin: Secondary | ICD-10-CM | POA: Insufficient documentation

## 2011-07-09 DIAGNOSIS — I10 Essential (primary) hypertension: Secondary | ICD-10-CM | POA: Insufficient documentation

## 2011-07-09 DIAGNOSIS — Z794 Long term (current) use of insulin: Secondary | ICD-10-CM | POA: Insufficient documentation

## 2011-07-09 DIAGNOSIS — K2990 Gastroduodenitis, unspecified, without bleeding: Secondary | ICD-10-CM

## 2011-07-09 HISTORY — PX: ESOPHAGOGASTRODUODENOSCOPY: SHX5428

## 2011-07-09 LAB — GLUCOSE, CAPILLARY
Glucose-Capillary: 82 mg/dL (ref 70–99)
Glucose-Capillary: 207 mg/dL — ABNORMAL HIGH (ref 70–99)

## 2011-07-09 SURGERY — EGD (ESOPHAGOGASTRODUODENOSCOPY)
Anesthesia: Moderate Sedation

## 2011-07-09 MED ORDER — BUTAMBEN-TETRACAINE-BENZOCAINE 2-2-14 % EX AERO
INHALATION_SPRAY | CUTANEOUS | Status: DC | PRN
  Administered 2011-07-09: 2 via TOPICAL

## 2011-07-09 MED ORDER — ONDANSETRON HCL 4 MG PO TABS: 4.0000 mg | ORAL_TABLET | Freq: Every day | ORAL | Status: DC | PRN

## 2011-07-09 MED ORDER — MIDAZOLAM HCL 5 MG/5ML IJ SOLN
INTRAMUSCULAR | Status: DC | PRN
  Administered 2011-07-09 (×2): 2 mg via INTRAVENOUS

## 2011-07-09 MED ORDER — MIDAZOLAM HCL 5 MG/5ML IJ SOLN
INTRAMUSCULAR | Status: AC
  Filled 2011-07-09: qty 10

## 2011-07-09 MED ORDER — DEXTROSE IN LACTATED RINGERS 5 % IV SOLN
INTRAVENOUS | Status: DC
  Administered 2011-07-09: 09:00:00 via INTRAVENOUS

## 2011-07-09 MED ORDER — MEPERIDINE HCL 100 MG/ML IJ SOLN
INTRAMUSCULAR | Status: AC
  Filled 2011-07-09: qty 1

## 2011-07-09 MED ORDER — MEPERIDINE HCL 100 MG/ML IJ SOLN
INTRAMUSCULAR | Status: DC | PRN
  Administered 2011-07-09 (×2): 25 mg via INTRAVENOUS

## 2011-07-09 NOTE — Interval H&P Note (Signed)
History and Physical Interval Note:   07/09/2011   8:32 AM   [name]  has presented today for surgery, with the diagnosis of wt.loss, n./v, epigastric pain  The various methods of treatment have been discussed with the patient and family. After consideration of risks, benefits and other options for treatment, the patient has consented to  Procedure(s): ESOPHAGOGASTRODUODENOSCOPY (EGD) as a surgical intervention .  I have reviewed the patients' chart and labs.  Questions were answered to the patient's satisfaction.     Jonette Eva  MD

## 2011-07-09 NOTE — H&P (Signed)
Diagnoses     Weight loss, non-intentional     783.21    Nausea     787.02    Epigastric pain     789.06      Reason for Visit     Weight Loss        Current Vitals       Recorded User        06/27/2011  9:56 AM  Trudee Kuster, LPN           BP Pulse Temp (Src) Resp Ht Wt    124/66  79  96.9 F (36.1 C) (Tympanic)  N/A  5\' 5"  (1.651 m)  66.497 kg (146 lb 9.6 oz)       BMI SpO2 PF LMP    24.40 kg/m2  N/A  N/A  N/A          Progress Notes     Tana Coast, PA  06/27/2011 11:28 AM  Signed Primary Care Physician:  Milana Obey, MD   Primary Gastroenterologist:  Jonette Eva, MD    Chief Complaint   Patient presents with   .  Weight Loss      HPI:  [name] is a 77 y.o. female here at the request of Dr. Sudie Bailey for further evaluation of weight loss, vomiting. She presents with her caregiver from [facility]. She has been a resident there for nearly 15 years. Weight usually 180. Last year started to be picky eater. Really having to encourage patient to eat. December 2011, Weight 170 at [facility]. March, pacemaker revised. Started to have lots of nausea and weight loss progressive. Recently switched from Dr. Janna Arch to Dr. Sudie Bailey. Chronic DM, Hgb 7. Friday, last time with dry heaves. No abdominal pain. BM regular. Sometimes stools hard, has rectal burning. No melena, brbpr. PRN stool softners/laxative. Denies dysphagia or odynophagia. Symptoms seem to get worse after having her pacemaker revised in March of this year.      Current Outpatient Prescriptions   Medication  Sig  Dispense  Refill   .  acetaminophen (TYLENOL) 325 MG tablet  Take 650 mg by mouth every 6 (six) hours as needed.           Marland Kitchen  allopurinol (ZYLOPRIM) 100 MG tablet  Take 100 mg by mouth daily.           Marland Kitchen  amLODipine (NORVASC) 10 MG tablet  Take 10 mg by mouth daily.           Marland Kitchen  aspirin 81 MG tablet  Take 81 mg by mouth daily.           Marland Kitchen  atorvastatin (LIPITOR)  40 MG tablet  Take 20 mg by mouth.          Marland Kitchen  b complex vitamins tablet  Take 1 tablet by mouth daily.           .  benazepril (LOTENSIN) 20 MG tablet  Take 10 mg by mouth daily.          Marland Kitchen  donepezil (ARICEPT) 5 MG tablet  Take 5 mg by mouth at bedtime as needed.           .  insulin NPH-insulin regular (NOVOLIN 70/30) (70-30) 100 UNIT/ML injection  Inject 25 Units into the skin daily with breakfast. 10 units bid           .  levothyroxine (SYNTHROID, LEVOTHROID) 50 MCG tablet  Take 50 mcg by mouth daily.           Marland Kitchen  metoprolol (TOPROL-XL) 50 MG 24 hr tablet  Take 50 mg by mouth daily.           Marland Kitchen  UNKNOWN TO PATIENT  Vitamin D  50000 units  Once weekly          .  ergocalciferol (VITAMIN D2) 50000 UNITS capsule  Take 50,000 Units by mouth once a week.           .  insulin glargine (LANTUS) 100 UNIT/ML injection  Inject 10 Units into the skin at bedtime.           Marland Kitchen  LORazepam (ATIVAN) 0.5 MG tablet  Take 0.5 mg by mouth every 8 (eight) hours. As needed         .  omeprazole (PRILOSEC) 20 MG capsule  Take 1 capsule (20 mg total) by mouth daily before breakfast.   30 capsule   11   .  Thiamine HCl (VITAMIN B-1) 100 MG tablet  Take 100 mg by mouth daily.           .  vitamin B-12 (CYANOCOBALAMIN) 100 MCG tablet  Take 50 mcg by mouth daily.               Allergies as of 06/27/2011   .  (No Known Allergies)       Past Medical History   Diagnosis  Date   .  Diabetes mellitus         insulin dependant   .  Hypertension     .  Hyperlipidemia     .  Paroxysmal atrial fibrillation     .  GERD (gastroesophageal reflux disease)     .  Dementia     .  Cardiac pacemaker  2003, revised 2012       implant   .  Hypothyroidism         Past Surgical History   Procedure  Date   .  Pacemaker insertion     .  Appendectomy     .  Hernia repair  2007       incarcerated   .  Hip fracture surgery  2003       left       Family History   Problem  Relation  Age of Onset   .  Diabetes  Father      .  Colon cancer  Neg Hx         History       Social History   .  Marital Status:  Widowed       Spouse Name:  N/A       Number of Children:  N/A   .  Years of Education:  N/A       Occupational History   .  retired         living in assisted living, [place]   .           retired [employer]       Social History Main Topics   .  Smoking status:  Former Games developer   .  Smokeless tobacco:  Never Used     Comment: Quit 20 plus years   .  Alcohol Use:  No   .  Drug Use:  No   .  Sexually Active:  Not on file       Other Topics  Concern   .  Not on file       Social History Narrative   .  No narrative on file        ROS:   General: Negative for fever, chills, fatigue, weakness. See HPI. Eyes: Negative for vision changes.   ENT: Negative for hoarseness, difficulty swallowing , nasal congestion. CV: Negative for chest pain, angina, palpitations, dyspnea on exertion, peripheral edema.   Respiratory: Negative for dyspnea at rest, dyspnea on exertion, cough, sputum, wheezing.   GI: See history of present illness. GU:  Negative for dysuria, hematuria, urinary incontinence, urinary frequency, nocturnal urination.   MS: Negative for joint pain, low back pain.   Derm: Negative for rash or itching.   Neuro: Negative for weakness, abnormal sensation, seizure, frequent headaches, memory loss, confusion. Patient has h/o dementia but overall doing well per caregiver. No children. Sister sometimes signs consents but patient does not have Midwife. Psych: Negative for anxiety, depression, suicidal ideation, hallucinations.   Endo: See HPI.   Heme: Negative for bruising or bleeding. Allergy: Negative for rash or hives.     Physical Examination:   BP 124/66  Pulse 79  Temp(Src) 96.9 F (36.1 C) (Tympanic)  Ht 5\' 5"  (1.651 m)  Wt 146 lb 9.6 oz (66.497 kg)  BMI 24.40 kg/m2    General: Well-nourished, well-developed in no acute distress.     Head: Normocephalic, atraumatic.    Eyes: Conjunctiva pink, no icterus. Mouth: Oropharyngeal mucosa moist and pink , no lesions erythema or exudate. Neck: Supple without thyromegaly, masses, or lymphadenopathy.   Lungs: Clear to auscultation bilaterally.   Heart: Regular rate and rhythm, no murmurs rubs or gallops.   Abdomen: Bowel sounds are normal, nontender, nondistended, no hepatosplenomegaly or masses, no abdominal bruits or hernia , no rebound or guarding.    Extremities: No lower extremity edema.   Neuro: Alert and oriented x 4 , grossly normal neurologically.   Skin: Warm and dry, no rash or jaundice.    Psych: Alert and cooperative, normal mood and affect.          Glendora Score  06/27/2011  3:40 PM  Signed Cc to PCP        Weight loss, non-intentional - Tana Coast, PA  06/27/2011 11:26 AM  Addendum Unintentional weight loss associated with anorexia and dry heaves. On 02/17/2011 during hospitalization she weighed 175 pounds. At [facility] assisted living she weighed 170 pounds in December 2011. Current weight 146 pounds. Progressive difficulty encouraging patient to eat. She does have some dementia but overall does well according to the caregiver. No behavioral issues. She does have epigastric tenderness on exam. She has dry heaves. Recommend rule out upper GI tract etiology. EGD in the near future. I have discussed the risks, alternatives, benefits with regards to but not limited to the risk of reaction to medication, bleeding, infection, perforation and the patient is agreeable to proceed. Written consent to be obtained.   Omeprazole 20 mg daily.     Previous Version  Nausea - Tana Coast, PA  06/27/2011 11:27 AM  Signed Referred to weight loss assessment and plan.  Epigastric pain - Tana Coast, PA  06/27/2011 11:27 AM  Signed Referred to epigastric pain assessment and plan.

## 2011-07-11 ENCOUNTER — Encounter (HOSPITAL_COMMUNITY): Payer: Self-pay

## 2011-07-11 ENCOUNTER — Telehealth: Payer: Self-pay | Admitting: Gastroenterology

## 2011-07-11 ENCOUNTER — Encounter (HOSPITAL_COMMUNITY)
Admit: 2011-07-11 | Discharge: 2011-07-11 | Disposition: A | Discharge status: Home / Self Care | Payer: Medicare Other | Source: Ambulatory Visit | Attending: Gastroenterology | Admitting: Gastroenterology

## 2011-07-11 DIAGNOSIS — R1110 Vomiting, unspecified: Secondary | ICD-10-CM

## 2011-07-11 DIAGNOSIS — E119 Type 2 diabetes mellitus without complications: Secondary | ICD-10-CM | POA: Insufficient documentation

## 2011-07-11 DIAGNOSIS — K3189 Other diseases of stomach and duodenum: Secondary | ICD-10-CM | POA: Insufficient documentation

## 2011-07-11 HISTORY — DX: Disorder of kidney and ureter, unspecified: N28.9

## 2011-07-11 MED ORDER — TECHNETIUM TC 99M SULFUR COLLOID
2.0000 | Freq: Once | INTRAVENOUS | Status: AC | PRN
  Administered 2011-07-11: 2 via INTRAVENOUS

## 2011-07-11 NOTE — Telephone Encounter (Signed)
Cc to PCP an F/U OPV is in the computer

## 2011-07-11 NOTE — Telephone Encounter (Signed)
Please call pt. She has mild gastritis. Her gastric emptying study shows her diabetes has damaged the nerves in her stomach and so she does not empty her stomach very well, which cause vomiting and nausea. She needs a diabetic/gastroparesis diet. OPV in 2-3 weeks to discuss starting meds for gastroparesis. Continue OMP daily.

## 2011-07-12 NOTE — Progress Notes (Signed)
PT HAS GASTRITIS AND GASTROPARESIS.

## 2011-07-12 NOTE — Telephone Encounter (Signed)
Pt's caregiver, [name], informed. She wrote down the info. She will have [facility] call if any questions. Gastroparesis diet mailed. Wrote on front of the diet that pt must stay within her diabetic boundaries.

## 2011-07-12 NOTE — Telephone Encounter (Signed)
LMOM to call.

## 2011-07-16 ENCOUNTER — Encounter (HOSPITAL_COMMUNITY): Payer: Self-pay | Admitting: Gastroenterology

## 2011-08-18 ENCOUNTER — Emergency Department (HOSPITAL_COMMUNITY)
Admission: EM | Admit: 2011-08-18 | Discharge: 2011-08-18 | Disposition: A | Discharge status: Home / Self Care | Payer: Medicare Other | Attending: Emergency Medicine | Admitting: Emergency Medicine

## 2011-08-18 ENCOUNTER — Encounter (HOSPITAL_COMMUNITY): Payer: Self-pay | Admitting: *Deleted

## 2011-08-18 DIAGNOSIS — N390 Urinary tract infection, site not specified: Secondary | ICD-10-CM | POA: Insufficient documentation

## 2011-08-18 DIAGNOSIS — J45909 Unspecified asthma, uncomplicated: Secondary | ICD-10-CM | POA: Insufficient documentation

## 2011-08-18 DIAGNOSIS — I10 Essential (primary) hypertension: Secondary | ICD-10-CM | POA: Insufficient documentation

## 2011-08-18 DIAGNOSIS — E785 Hyperlipidemia, unspecified: Secondary | ICD-10-CM | POA: Insufficient documentation

## 2011-08-18 DIAGNOSIS — I4891 Unspecified atrial fibrillation: Secondary | ICD-10-CM | POA: Insufficient documentation

## 2011-08-18 DIAGNOSIS — Z950 Presence of cardiac pacemaker: Secondary | ICD-10-CM | POA: Insufficient documentation

## 2011-08-18 DIAGNOSIS — E119 Type 2 diabetes mellitus without complications: Secondary | ICD-10-CM | POA: Insufficient documentation

## 2011-08-18 DIAGNOSIS — F0390 Unspecified dementia without behavioral disturbance: Secondary | ICD-10-CM | POA: Insufficient documentation

## 2011-08-18 DIAGNOSIS — K219 Gastro-esophageal reflux disease without esophagitis: Secondary | ICD-10-CM | POA: Insufficient documentation

## 2011-08-18 LAB — DIFFERENTIAL
Basophils Absolute: 0 10*3/uL (ref 0.0–0.1)
Eosinophils Absolute: 0.1 10*3/uL (ref 0.0–0.7)
Eosinophils Relative: 3 % (ref 0–5)
Lymphocytes Relative: 32 % (ref 12–46)

## 2011-08-18 LAB — URINALYSIS, ROUTINE W REFLEX MICROSCOPIC
Hgb urine dipstick: NEGATIVE
Nitrite: NEGATIVE
Protein, ur: NEGATIVE mg/dL
Urobilinogen, UA: 1 mg/dL (ref 0.0–1.0)

## 2011-08-18 LAB — CBC
MCH: 30.5 pg (ref 26.0–34.0)
MCV: 89.7 fL (ref 78.0–100.0)
Platelets: 293 10*3/uL (ref 150–400)
RDW: 13.5 % (ref 11.5–15.5)
WBC: 4.1 10*3/uL (ref 4.0–10.5)

## 2011-08-18 LAB — URINE MICROSCOPIC-ADD ON

## 2011-08-18 LAB — BASIC METABOLIC PANEL
Calcium: 9.4 mg/dL (ref 8.4–10.5)
GFR calc non Af Amer: >60 mL/min (ref >60)
Sodium: 131 mEq/L — ABNORMAL LOW (ref 135–145)

## 2011-08-18 MED ORDER — CIPROFLOXACIN HCL 500 MG PO TABS: 500.0000 mg | ORAL_TABLET | Freq: Two times a day (BID) | ORAL | Status: DC

## 2011-08-18 NOTE — ED Notes (Signed)
Pt alert and oriented x 3. Skin warm and dry. Color pink. Breath sounds clear and equal bilaterally. Abdomen soft and non distended. Pt ambulates with assistance. Per caregiver the patient has been having problems with burning on urination and foul smell to urine.

## 2011-08-18 NOTE — ED Notes (Signed)
Dr Estell Harpin notified of pt request to speak with him regarding results.

## 2011-08-18 NOTE — ED Notes (Signed)
Pt ambulated to BR with assistance. NAD at this time.

## 2011-08-18 NOTE — ED Notes (Signed)
Pts caregiver states pt has been confused for about 2 days with agitation and weakness. Caregiver states pt normally walks but has been "stumbling around" for the last day.

## 2011-08-18 NOTE — ED Provider Notes (Signed)
History     CSN: [id number] Arrival date & time: 08/18/2011  9:55 AM  Chief Complaint  Patient presents with  . Altered Mental Status   Patient is a 77 y.o. female presenting with altered mental status. The history is provided by a caregiver.  Altered Mental Status Chronicity: pt has a hx of dementia but is a little more confused then normal according to care giver.   The current episode started yesterday. The problem occurs constantly. The problem has not changed since onset.Pertinent negatives include no chest pain, no abdominal pain and no headaches. Associated symptoms comments: Complains of dysuria. The symptoms are aggravated by nothing.    Past Medical History  Diagnosis Date  . Diabetes mellitus     insulin dependant  . Hypertension   . Hyperlipidemia   . Paroxysmal atrial fibrillation   . GERD (gastroesophageal reflux disease)   . Dementia   . Cardiac pacemaker 2003, revised 2012    implant  . Hypothyroidism   . Renal insufficiency   . Asthma     Past Surgical History  Procedure Date  . Pacemaker insertion   . Appendectomy   . Hernia repair 2007    incarcerated  . Hip fracture surgery 2003    left  . Esophagogastroduodenoscopy 07/09/2011    Procedure: ESOPHAGOGASTRODUODENOSCOPY (EGD);  Surgeon: Arlyce Harman, MD;  Location: [facility];  Service: Endoscopy;  Laterality: N/A;    Family History  Problem Relation Age of Onset  . Diabetes Father   . Colon cancer Neg Hx     History  Substance Use Topics  . Smoking status: Former Games developer  . Smokeless tobacco: Never Used   Comment: Quit 20 plus years  . Alcohol Use: No    OB History    Grav Para Term Preterm Abortions TAB SAB Ect Mult Living                  Review of Systems  Constitutional: Negative for fatigue.  HENT: Negative for congestion, sinus pressure and ear discharge.   Eyes: Negative for discharge.  Respiratory: Negative for cough.   Cardiovascular: Negative for chest pain.    Gastrointestinal: Negative for abdominal pain and diarrhea.  Genitourinary: Positive for dysuria. Negative for frequency and hematuria.  Musculoskeletal: Negative for back pain.  Skin: Negative for rash.  Neurological: Negative for seizures and headaches.  Hematological: Negative.   Psychiatric/Behavioral: Positive for altered mental status. Negative for hallucinations.    Physical Exam  BP 162/57  Pulse 60  Temp(Src) 97.8 F (36.6 C) (Oral)  Resp 18  Ht 5\' 5"  (1.651 m)  Wt 124 lb (56.246 kg)  BMI 20.63 kg/m2  SpO2 93%  Physical Exam  Constitutional: She appears well-developed.  HENT:  Head: Normocephalic and atraumatic.  Eyes: Conjunctivae and EOM are normal. No scleral icterus.  Neck: Neck supple. No thyromegaly present.  Cardiovascular: Normal rate and regular rhythm.  Exam reveals no gallop and no friction rub.   No murmur heard. Pulmonary/Chest: No stridor. She has no wheezes. She has no rales. She exhibits no tenderness.  Abdominal: She exhibits no distension. There is no tenderness. There is no rebound.  Musculoskeletal: Normal range of motion. She exhibits no edema.  Lymphadenopathy:    She has no cervical adenopathy.  Neurological: Coordination normal.       Pt alert but oriented to person only  Skin: No rash noted. No erythema.  Psychiatric: She has a normal mood and affect. Her behavior is normal.  ED Course  Procedures  MDM Worsening dementia,  uti causing confusion  Results for orders placed during the hospital encounter of 08/18/11  URINALYSIS, ROUTINE W REFLEX MICROSCOPIC      Component Value Range   Color, Urine YELLOW  YELLOW    Appearance CLEAR  CLEAR    Specific Gravity, Urine 1.020  1.005 - 1.030    pH 7.0  5.0 - 8.0    Glucose, UA NEGATIVE  NEGATIVE (mg/dL)   Hgb urine dipstick NEGATIVE  NEGATIVE    Bilirubin Urine NEGATIVE  NEGATIVE    Ketones, ur TRACE (*) NEGATIVE (mg/dL)   Protein, ur NEGATIVE  NEGATIVE (mg/dL)   Urobilinogen, UA  1.0  0.0 - 1.0 (mg/dL)   Nitrite NEGATIVE  NEGATIVE    Leukocytes, UA TRACE (*) NEGATIVE   CBC      Component Value Range   WBC 4.1  4.0 - 10.5 (K/uL)   RBC 3.70 (*) 3.87 - 5.11 (MIL/uL)   Hemoglobin 11.3 (*) 12.0 - 15.0 (g/dL)   HCT 40.9 (*) 81.1 - 46.0 (%)   MCV 89.7  78.0 - 100.0 (fL)   MCH 30.5  26.0 - 34.0 (pg)   MCHC 34.0  30.0 - 36.0 (g/dL)   RDW 91.4  78.2 - 95.6 (%)   Platelets 293  150 - 400 (K/uL)  DIFFERENTIAL      Component Value Range   Neutrophils Relative 56  43 - 77 (%)   Neutro Abs 2.3  1.7 - 7.7 (K/uL)   Lymphocytes Relative 32  12 - 46 (%)   Lymphs Abs 1.3  0.7 - 4.0 (K/uL)   Monocytes Relative 9  3 - 12 (%)   Monocytes Absolute 0.4  0.1 - 1.0 (K/uL)   Eosinophils Relative 3  0 - 5 (%)   Eosinophils Absolute 0.1  0.0 - 0.7 (K/uL)   Basophils Relative 0  0 - 1 (%)   Basophils Absolute 0.0  0.0 - 0.1 (K/uL)  BASIC METABOLIC PANEL      Component Value Range   Sodium 131 (*) 135 - 145 (mEq/L)   Potassium 4.0  3.5 - 5.1 (mEq/L)   Chloride 96  96 - 112 (mEq/L)   CO2 26  19 - 32 (mEq/L)   Glucose, Bld 165 (*) 70 - 99 (mg/dL)   BUN 12  6 - 23 (mg/dL)   Creatinine, Ser 2.13  0.50 - 1.10 (mg/dL)   Calcium 9.4  8.4 - 08.6 (mg/dL)   GFR calc non Af Amer >60  >60 (mL/min)   GFR calc Af Amer >60  >60 (mL/min)  URINE MICROSCOPIC-ADD ON      Component Value Range   Squamous Epithelial / LPF RARE  RARE    WBC, UA 3-6  <3 (WBC/hpf)   RBC / HPF 0-2  <3 (RBC/hpf)   Bacteria, UA FEW (*) RARE    No results found.      Benny Lennert, MD 08/18/11 1355

## 2011-08-22 ENCOUNTER — Ambulatory Visit: Payer: Medicare Other | Admitting: Gastroenterology

## 2011-08-27 ENCOUNTER — Encounter: Payer: Self-pay | Admitting: Gastroenterology

## 2011-08-27 ENCOUNTER — Ambulatory Visit (INDEPENDENT_AMBULATORY_CARE_PROVIDER_SITE_OTHER): Payer: Medicare Other | Admitting: Gastroenterology

## 2011-08-27 VITALS — BP 136/70 | HR 75 | Temp 97.8°F | Ht 65.0 in | Wt 144.8 lb

## 2011-08-27 DIAGNOSIS — E1149 Type 2 diabetes mellitus with other diabetic neurological complication: Secondary | ICD-10-CM

## 2011-08-27 DIAGNOSIS — K5900 Constipation, unspecified: Secondary | ICD-10-CM | POA: Insufficient documentation

## 2011-08-27 DIAGNOSIS — E1143 Type 2 diabetes mellitus with diabetic autonomic (poly)neuropathy: Secondary | ICD-10-CM | POA: Insufficient documentation

## 2011-08-27 DIAGNOSIS — K3184 Gastroparesis: Secondary | ICD-10-CM

## 2011-08-27 MED ORDER — POLYETHYLENE GLYCOL 3350 GRAN: 17.0000 g | GRANULES | Freq: Every day | Status: DC

## 2011-08-27 NOTE — Assessment & Plan Note (Signed)
Patient no longer with symptoms of anorexia, vomiting. Actually eating better. Eating 3 meals and 3 snacks daily. Weight stabilized. Discussed with caregiver regarding possibility of starting Reglan. Would recommend holding off as long as patient asymptomatic. OV in 3 months. They will call sooner if recurrent symptoms or ongoing weight loss.

## 2011-08-27 NOTE — Progress Notes (Signed)
Primary Care Physician: Milana Obey, MD  Primary Gastroenterologist:  Jonette Eva, MD   Chief Complaint  Patient presents with  . Follow-up    stomach hurts    HPI: [name] is a 77 y.o. female here for f/u. EGD showed gastritis, schatzki ring, small hh. GES showed gastroparesis. Patient states she is doing better. Denies abd pain, vomiting. States appetite is better. Caregiver states patients is eating 3 meals and 3 snacks daily without vomiting. Her weight loss has stabilized. Patient c/o constipation intermittently. Takes MOM prn. Wants something to take daily. No BM in 3 days. No rectal bleeding or melena. Has to strain to have BM. Stools hard. Recent UTI resolved after Cipro.  Current Outpatient Prescriptions  Medication Sig Dispense Refill  . acetaminophen (TYLENOL) 325 MG tablet Take 325-650 mg by mouth every 4 (four) hours as needed. For pain      . allopurinol (ZYLOPRIM) 100 MG tablet Take 100 mg by mouth daily.        Marland Kitchen amLODipine (NORVASC) 10 MG tablet Take 10 mg by mouth daily.        Marland Kitchen aspirin 81 MG tablet Take 81 mg by mouth daily.        Marland Kitchen b complex vitamins tablet Take 1 tablet by mouth daily.       . benazepril (LOTENSIN) 10 MG tablet Take 10 mg by mouth daily.        Marland Kitchen donepezil (ARICEPT) 5 MG tablet Take 5 mg by mouth at bedtime as needed.        . ergocalciferol (VITAMIN D2) 50000 UNITS capsule Take 50,000 Units by mouth once a week.        . insulin NPH-insulin regular (NOVOLIN 70/30) (70-30) 100 UNIT/ML injection Inject 10 Units into the skin 2 (two) times daily. Inject 10 units twice daily at 0800 and 1700       . levothyroxine (SYNTHROID, LEVOTHROID) 50 MCG tablet Take 50 mcg by mouth daily.        Marland Kitchen LORazepam (ATIVAN) 0.5 MG tablet Take 0.25 mg by mouth every 8 (eight) hours.       . metoprolol (LOPRESSOR) 50 MG tablet Take 50 mg by mouth 2 (two) times daily.        Marland Kitchen omeprazole (PRILOSEC) 20 MG capsule Take 20 mg by mouth daily before breakfast.         . ondansetron (ZOFRAN) 8 MG tablet Take 8 mg by mouth every 6 (six) hours as needed. For nausea       . UNKNOWN TO PATIENT Vitamin D  50000 units  Once weekly       . vitamin B-12 (CYANOCOBALAMIN) 1000 MCG tablet Take 2,000 mcg by mouth daily.        . Polyethylene Glycol 3350 GRAN Take 17 g by mouth daily.  527 g  5    Allergies as of 08/27/2011  . (No Known Allergies)    ROS:  General: Negative for anorexia, weight loss, fever, chills, fatigue, weakness. ENT: Negative for hoarseness, difficulty swallowing , nasal congestion. CV: Negative for chest pain, angina, palpitations, dyspnea on exertion, peripheral edema.  Respiratory: Negative for dyspnea at rest, dyspnea on exertion, cough, sputum, wheezing.  GI: See history of present illness. GU:  Negative for dysuria, hematuria, urinary incontinence, urinary frequency, nocturnal urination.  Endo: Negative for unusual weight change.    Physical Examination:   BP 136/70  Pulse 75  Temp(Src) 97.8 F (36.6 C) (Temporal)  Ht 5\' 5"  (  1.651 m)  Wt 144 lb 12.8 oz (65.681 kg)  BMI 24.10 kg/m2  General: Well-nourished, well-developed in no acute distress.  Eyes: No icterus. Mouth: Oropharyngeal mucosa moist and pink , no lesions erythema or exudate. Lungs: Clear to auscultation bilaterally.  Heart: Regular rate and rhythm, no murmurs rubs or gallops.  Abdomen: Bowel sounds are normal, nontender, nondistended, no hepatosplenomegaly or masses, no abdominal bruits or hernia , no rebound or guarding.   Extremities: No lower extremity edema. No clubbing or deformities. Neuro: Alert and oriented x 4   Skin: Warm and dry, no jaundice.   Psych: Alert and cooperative, normal mood and affect.

## 2011-08-27 NOTE — Assessment & Plan Note (Signed)
Begin Miralax 17g daily.

## 2011-08-27 NOTE — Progress Notes (Signed)
Cc to PCP 

## 2011-08-27 NOTE — Patient Instructions (Signed)
Please let us know if patient has recurrent vomiting, weight loss, decreased appetite. She may need to have medication for delayed gastric emptying. For now, continue 5-6 small meals daily. Start Miralax for constipation.

## 2011-09-10 NOTE — Progress Notes (Signed)
REVIEWED. AGREE. No Reglan unless dietary management fails.

## 2011-09-20 LAB — CBC
MCHC: 33.5 g/dL (ref 30.0–36.0)
Platelets: 295 10*3/uL (ref 150–400)
RDW: 13.9 % (ref 11.5–15.5)

## 2011-09-20 LAB — DIFFERENTIAL
Eosinophils Absolute: 0.1 10*3/uL (ref 0.0–0.7)
Lymphs Abs: 0.9 10*3/uL (ref 0.7–4.0)
Monocytes Absolute: 0.1 10*3/uL (ref 0.1–1.0)
Monocytes Relative: 3 % (ref 3–12)
Neutrophils Relative %: 81 % — ABNORMAL HIGH (ref 43–77)

## 2011-09-20 LAB — URINALYSIS, ROUTINE W REFLEX MICROSCOPIC
Glucose, UA: NEGATIVE mg/dL
Hgb urine dipstick: NEGATIVE
Protein, ur: NEGATIVE mg/dL

## 2011-09-20 LAB — COMPREHENSIVE METABOLIC PANEL
ALT: 21 U/L (ref 0–35)
AST: 28 U/L (ref 0–37)
Albumin: 4.4 g/dL (ref 3.5–5.2)
Calcium: 10 mg/dL (ref 8.4–10.5)
GFR calc Af Amer: >60 mL/min (ref >60)
Glucose, Bld: 114 mg/dL — ABNORMAL HIGH (ref 70–99)
Sodium: 134 mEq/L — ABNORMAL LOW (ref 135–145)
Total Protein: 8.2 g/dL (ref 6.0–8.3)

## 2011-09-20 LAB — URINE CULTURE: Culture: NO GROWTH

## 2011-09-20 LAB — CULTURE, BLOOD (ROUTINE X 2)
Culture: NO GROWTH
Culture: NO GROWTH
Report Status: 12142009

## 2011-09-20 LAB — POCT CARDIAC MARKERS

## 2011-09-20 LAB — GLUCOSE, CAPILLARY

## 2011-11-28 ENCOUNTER — Encounter: Payer: Self-pay | Admitting: Gastroenterology

## 2011-11-28 ENCOUNTER — Ambulatory Visit (INDEPENDENT_AMBULATORY_CARE_PROVIDER_SITE_OTHER): Payer: Medicare Other | Admitting: Gastroenterology

## 2011-11-28 DIAGNOSIS — K2990 Gastroduodenitis, unspecified, without bleeding: Secondary | ICD-10-CM

## 2011-11-28 DIAGNOSIS — K2970 Gastritis, unspecified, without bleeding: Secondary | ICD-10-CM

## 2011-11-28 DIAGNOSIS — K5900 Constipation, unspecified: Secondary | ICD-10-CM

## 2011-11-28 DIAGNOSIS — K3184 Gastroparesis: Secondary | ICD-10-CM

## 2011-11-28 DIAGNOSIS — E1149 Type 2 diabetes mellitus with other diabetic neurological complication: Secondary | ICD-10-CM

## 2011-11-28 NOTE — Patient Instructions (Signed)
TAKE MIRALAX EVERY Monday, Wednesday, & Friday. CONTINUE PRILOSEC DAILY. FOLLOW UP IN 4 MOS.

## 2011-11-28 NOTE — Progress Notes (Signed)
, Subjective:    Patient ID: [name], female    DOB: 02-18-1925, 77 y.o.   MRN: [id number]  PCP: DONDIEGO, MD   HPI BMS: 2-3 TIMES A DAYS ND DURING THE NIGHT: VARIES SOFT. NO VOMITIING. APPETITE GOOD. WEIGHT STABLE. NO QUESTIONS OR CONCERNS.  Past Medical History  Diagnosis Date  . Diabetes mellitus     insulin dependant  . Hypertension   . Hyperlipidemia   . Paroxysmal atrial fibrillation   . GERD (gastroesophageal reflux disease)   . Dementia   . Cardiac pacemaker 2003, revised 2012    implant  . Hypothyroidism   . Renal insufficiency   . Asthma   . Gastroparesis 06/2011    abnormal GES, 80% retention at two hours    Past Surgical History  Procedure Date  . Pacemaker insertion   . Appendectomy   . Hernia repair 2007    incarcerated  . Hip fracture surgery 2003    left  . Esophagogastroduodenoscopy 07/09/2011    Procedure: ESOPHAGOGASTRODUODENOSCOPY (EGD);  Surgeon: Arlyce Harman, MD;  Location: [facility];  Service: Endoscopy;  Laterality: N/A;.  Mild gastritis, schatzki ring, small hh    No Known Allergies  Current Outpatient Prescriptions  Medication Sig Dispense Refill  . acetaminophen (TYLENOL) 325 MG tablet Take 325-650 mg by mouth every 4 (four) hours as needed. For pain      . allopurinol (ZYLOPRIM) 100 MG tablet Take 100 mg by mouth daily.        Marland Kitchen amLODipine (NORVASC) 10 MG tablet Take 10 mg by mouth daily.        Marland Kitchen aspirin 81 MG tablet Take 81 mg by mouth daily.        Marland Kitchen atorvastatin (LIPITOR) 20 MG tablet Take 20 mg by mouth daily.        Marland Kitchen b complex vitamins tablet Take 1 tablet by mouth daily.       . benazepril (LOTENSIN) 10 MG tablet Take 10 mg by mouth daily.        Marland Kitchen donepezil (ARICEPT) 5 MG tablet Take 5 mg by mouth at bedtime as needed.        . ergocalciferol (VITAMIN D2) 50000 UNITS capsule Take 50,000 Units by mouth once a week.        . insulin NPH-insulin regular (NOVOLIN 70/30) (70-30) 100 UNIT/ML injection Inject 10 Units  into the skin 2 (two) times daily. Inject 10 units twice daily at 0800 and 1700       . levothyroxine (SYNTHROID, LEVOTHROID) 50 MCG tablet Take 50 mcg by mouth daily.        Marland Kitchen LORazepam (ATIVAN) 0.5 MG tablet Take 0.25 mg by mouth every 8 (eight) hours.       . metoprolol (LOPRESSOR) 50 MG tablet Take 50 mg by mouth 2 (two) times daily.        Marland Kitchen omeprazole (PRILOSEC) 20 MG capsule Take 20 mg by mouth daily before breakfast.        . ondansetron (ZOFRAN) 8 MG tablet Take 8 mg by mouth every 6 (six) hours as needed. For nausea       . Polyethylene Glycol 3350 GRAN Take 17 g by mouth daily.  527 g  5  . UNKNOWN TO PATIENT Vitamin D  50000 units  Once weekly       . vitamin B-12 (CYANOCOBALAMIN) 1000 MCG tablet Take 2,000 mcg by mouth daily.  [place] Review of Systems     Objective:   Physical Exam  Vitals reviewed. Constitutional: She is oriented to person, place, and time. She appears well-developed and well-nourished. No distress.  HENT:  Head: Normocephalic and atraumatic.  Neck: Normal range of motion. Neck supple.  Cardiovascular: Normal rate, regular rhythm and normal heart sounds.   Pulmonary/Chest: Effort normal and breath sounds normal. No respiratory distress.  Abdominal: Soft. Bowel sounds are normal. She exhibits no distension. There is no tenderness.  Neurological: She is alert and oriented to person, place, and time.          Assessment & Plan:

## 2011-11-29 DIAGNOSIS — K2970 Gastritis, unspecified, without bleeding: Secondary | ICD-10-CM | POA: Insufficient documentation

## 2011-11-29 NOTE — Assessment & Plan Note (Signed)
CONTINUE PRILOSEC DAILY. FOLLOW UP IN 4 MOS.

## 2011-11-29 NOTE — Progress Notes (Signed)
Cc to PCP 

## 2011-11-29 NOTE — Assessment & Plan Note (Signed)
SX CONTROLLED WITH DIET.  CONTINUE GASTROPARESIS DIET. CONTINUE PRILOSEC DAILY. FOLLOW UP IN 4 MOS.

## 2011-11-29 NOTE — Assessment & Plan Note (Signed)
IMPROVED. NOW HAVING SEVERAL BMs A DAY.  TAKE MIRALAX EVERY Monday, Wednesday, & Friday. FOLLOW UP IN 4 MOS.

## 2011-12-06 NOTE — Progress Notes (Signed)
Reminder in epic to follow up in 4 months °

## 2011-12-08 ENCOUNTER — Other Ambulatory Visit: Payer: Self-pay

## 2011-12-08 ENCOUNTER — Emergency Department (HOSPITAL_COMMUNITY): Payer: Medicare Other

## 2011-12-08 ENCOUNTER — Inpatient Hospital Stay (HOSPITAL_COMMUNITY)
Admission: EM | Admit: 2011-12-08 | Discharge: 2011-12-12 | DRG: 178 | Disposition: A | Discharge status: Nursing Home (Includes ICF) | Payer: Medicare Other | Attending: Family Medicine | Admitting: Family Medicine

## 2011-12-08 ENCOUNTER — Encounter (HOSPITAL_COMMUNITY): Payer: Self-pay | Admitting: *Deleted

## 2011-12-08 DIAGNOSIS — F0390 Unspecified dementia without behavioral disturbance: Secondary | ICD-10-CM | POA: Diagnosis present

## 2011-12-08 DIAGNOSIS — I129 Hypertensive chronic kidney disease with stage 1 through stage 4 chronic kidney disease, or unspecified chronic kidney disease: Secondary | ICD-10-CM | POA: Diagnosis present

## 2011-12-08 DIAGNOSIS — E039 Hypothyroidism, unspecified: Secondary | ICD-10-CM | POA: Diagnosis present

## 2011-12-08 DIAGNOSIS — J690 Pneumonitis due to inhalation of food and vomit: Principal | ICD-10-CM | POA: Diagnosis present

## 2011-12-08 DIAGNOSIS — E871 Hypo-osmolality and hyponatremia: Secondary | ICD-10-CM | POA: Diagnosis present

## 2011-12-08 DIAGNOSIS — J189 Pneumonia, unspecified organism: Secondary | ICD-10-CM

## 2011-12-08 DIAGNOSIS — IMO0001 Reserved for inherently not codable concepts without codable children: Secondary | ICD-10-CM | POA: Diagnosis present

## 2011-12-08 DIAGNOSIS — R739 Hyperglycemia, unspecified: Secondary | ICD-10-CM

## 2011-12-08 DIAGNOSIS — R0902 Hypoxemia: Secondary | ICD-10-CM

## 2011-12-08 DIAGNOSIS — E875 Hyperkalemia: Secondary | ICD-10-CM | POA: Diagnosis not present

## 2011-12-08 DIAGNOSIS — N189 Chronic kidney disease, unspecified: Secondary | ICD-10-CM | POA: Diagnosis present

## 2011-12-08 LAB — DIFFERENTIAL
Eosinophils Absolute: 0 10*3/uL (ref 0.0–0.7)
Lymphocytes Relative: 13 % (ref 12–46)
Lymphs Abs: 0.7 10*3/uL (ref 0.7–4.0)
Monocytes Relative: 8 % (ref 3–12)
Neutrophils Relative %: 79 % — ABNORMAL HIGH (ref 43–77)

## 2011-12-08 LAB — BASIC METABOLIC PANEL
BUN: 36 mg/dL — ABNORMAL HIGH (ref 6–23)
CO2: 23 mEq/L (ref 19–32)
Calcium: 8.4 mg/dL (ref 8.4–10.5)
Chloride: 90 mEq/L — ABNORMAL LOW (ref 96–112)
Creatinine, Ser: 1.36 mg/dL — ABNORMAL HIGH (ref 0.50–1.10)
GFR calc non Af Amer: 36 mL/min — ABNORMAL LOW (ref >90)
Glucose, Bld: 361 mg/dL — ABNORMAL HIGH (ref 70–99)
Glucose, Bld: 117 mg/dL — ABNORMAL HIGH (ref 70–99)
Potassium: 4.2 mEq/L (ref 3.5–5.1)
Sodium: 124 mEq/L — ABNORMAL LOW (ref 135–145)

## 2011-12-08 LAB — URINALYSIS, ROUTINE W REFLEX MICROSCOPIC
Glucose, UA: 500 mg/dL — AB
Hgb urine dipstick: NEGATIVE
Ketones, ur: NEGATIVE mg/dL
Protein, ur: NEGATIVE mg/dL

## 2011-12-08 LAB — CBC
Hemoglobin: 10.5 g/dL — ABNORMAL LOW (ref 12.0–15.0)
MCH: 30.1 pg (ref 26.0–34.0)
MCV: 88.8 fL (ref 78.0–100.0)
RBC: 3.49 MIL/uL — ABNORMAL LOW (ref 3.87–5.11)

## 2011-12-08 LAB — GLUCOSE, CAPILLARY
Glucose-Capillary: 528 mg/dL — ABNORMAL HIGH (ref 70–99)
Glucose-Capillary: 221 mg/dL — ABNORMAL HIGH (ref 70–99)

## 2011-12-08 LAB — MRSA PCR SCREENING: MRSA by PCR: NEGATIVE

## 2011-12-08 MED ORDER — GI COCKTAIL ~~LOC~~
30.0000 mL | Freq: Once | ORAL | Status: AC
  Administered 2011-12-08: 30 mL via ORAL
  Filled 2011-12-08: qty 30

## 2011-12-08 MED ORDER — DONEPEZIL HCL 5 MG PO TABS
5.0000 mg | ORAL_TABLET | Freq: Every day | ORAL | Status: DC
  Administered 2011-12-09 – 2011-12-11 (×4): 5 mg via ORAL
  Filled 2011-12-08 (×6): qty 1

## 2011-12-08 MED ORDER — VITAMIN D (ERGOCALCIFEROL) 1.25 MG (50000 UNIT) PO CAPS
50000.0000 [IU] | ORAL_CAPSULE | ORAL | Status: DC
  Administered 2011-12-09: 50000 [IU] via ORAL
  Filled 2011-12-08: qty 1

## 2011-12-08 MED ORDER — LEVOTHYROXINE SODIUM 50 MCG PO TABS
50.0000 ug | ORAL_TABLET | Freq: Every day | ORAL | Status: DC
  Administered 2011-12-08 – 2011-12-12 (×5): 50 ug via ORAL
  Filled 2011-12-08 (×2): qty 1
  Filled 2011-12-08: qty 2
  Filled 2011-12-08 (×3): qty 1
  Filled 2011-12-08: qty 2

## 2011-12-08 MED ORDER — ENOXAPARIN SODIUM 40 MG/0.4ML ~~LOC~~ SOLN
40.0000 mg | SUBCUTANEOUS | Status: DC
  Administered 2011-12-08: 40 mg via SUBCUTANEOUS
  Filled 2011-12-08: qty 0.4

## 2011-12-08 MED ORDER — POLYETHYLENE GLYCOL 3350 17 G PO PACK
17.0000 g | PACK | Freq: Every day | ORAL | Status: DC
  Administered 2011-12-09 – 2011-12-12 (×4): 17 g via ORAL
  Filled 2011-12-08 (×4): qty 1

## 2011-12-08 MED ORDER — IPRATROPIUM BROMIDE 0.02 % IN SOLN
RESPIRATORY_TRACT | Status: AC
  Filled 2011-12-08: qty 2.5

## 2011-12-08 MED ORDER — IPRATROPIUM BROMIDE 0.02 % IN SOLN
0.5000 mg | Freq: Once | RESPIRATORY_TRACT | Status: AC
  Administered 2011-12-08: 0.5 mg via RESPIRATORY_TRACT

## 2011-12-08 MED ORDER — AZITHROMYCIN 250 MG PO TABS
500.0000 mg | ORAL_TABLET | Freq: Once | ORAL | Status: AC
  Administered 2011-12-08: 500 mg via ORAL
  Filled 2011-12-08: qty 2

## 2011-12-08 MED ORDER — DEXTROSE 5 % IV SOLN
1.0000 g | Freq: Once | INTRAVENOUS | Status: AC
  Administered 2011-12-08: 1 g via INTRAVENOUS
  Filled 2011-12-08: qty 10

## 2011-12-08 MED ORDER — SIMVASTATIN 10 MG PO TABS
10.0000 mg | ORAL_TABLET | Freq: Every day | ORAL | Status: DC
  Administered 2011-12-08 – 2011-12-12 (×5): 10 mg via ORAL
  Filled 2011-12-08 (×8): qty 1

## 2011-12-08 MED ORDER — FUROSEMIDE 10 MG/ML IJ SOLN
20.0000 mg | Freq: Once | INTRAMUSCULAR | Status: AC
  Administered 2011-12-08: 20 mg via INTRAVENOUS
  Filled 2011-12-08: qty 2

## 2011-12-08 MED ORDER — B COMPLEX-C PO TABS
1.0000 | ORAL_TABLET | Freq: Every day | ORAL | Status: DC
  Administered 2011-12-08 – 2011-12-12 (×5): 1 via ORAL
  Filled 2011-12-08 (×7): qty 1

## 2011-12-08 MED ORDER — DEXTROSE 5 % IV SOLN
1.0000 g | INTRAVENOUS | Status: DC
  Administered 2011-12-08 – 2011-12-10 (×3): 1 g via INTRAVENOUS
  Filled 2011-12-08 (×4): qty 10

## 2011-12-08 MED ORDER — ONDANSETRON HCL 4 MG PO TABS
8.0000 mg | ORAL_TABLET | Freq: Four times a day (QID) | ORAL | Status: DC | PRN
  Administered 2011-12-12: 8 mg via ORAL
  Filled 2011-12-08: qty 2

## 2011-12-08 MED ORDER — AZITHROMYCIN 250 MG PO TABS
500.0000 mg | ORAL_TABLET | Freq: Every day | ORAL | Status: DC
  Administered 2011-12-08 – 2011-12-11 (×4): 500 mg via ORAL
  Filled 2011-12-08 (×4): qty 2

## 2011-12-08 MED ORDER — PANTOPRAZOLE SODIUM 40 MG PO TBEC
40.0000 mg | DELAYED_RELEASE_TABLET | Freq: Every day | ORAL | Status: DC
  Administered 2011-12-08 – 2011-12-12 (×5): 40 mg via ORAL
  Filled 2011-12-08 (×5): qty 1

## 2011-12-08 MED ORDER — SODIUM CHLORIDE 0.9 % IV SOLN
INTRAVENOUS | Status: DC
  Administered 2011-12-08 – 2011-12-09 (×2): via INTRAVENOUS

## 2011-12-08 MED ORDER — VITAMIN B-12 1000 MCG PO TABS
2000.0000 ug | ORAL_TABLET | Freq: Every day | ORAL | Status: DC
  Administered 2011-12-08 – 2011-12-12 (×5): 2000 ug via ORAL
  Filled 2011-12-08 (×7): qty 2

## 2011-12-08 MED ORDER — DEXTROSE 5 % IV SOLN
INTRAVENOUS | Status: AC
  Filled 2011-12-08: qty 10

## 2011-12-08 MED ORDER — INSULIN REGULAR HUMAN 100 UNIT/ML IJ SOLN
INTRAMUSCULAR | Status: DC
  Administered 2011-12-08: 3.5 [IU]/h via INTRAVENOUS
  Filled 2011-12-08: qty 1

## 2011-12-08 MED ORDER — ALBUTEROL SULFATE (5 MG/ML) 0.5% IN NEBU
INHALATION_SOLUTION | RESPIRATORY_TRACT | Status: AC
  Filled 2011-12-08: qty 1

## 2011-12-08 MED ORDER — POTASSIUM CHLORIDE CRYS ER 20 MEQ PO TBCR
40.0000 meq | EXTENDED_RELEASE_TABLET | Freq: Two times a day (BID) | ORAL | Status: DC
  Administered 2011-12-08 – 2011-12-09 (×3): 40 meq via ORAL
  Filled 2011-12-08 (×4): qty 2

## 2011-12-08 MED ORDER — SODIUM CHLORIDE 0.9 % IV BOLUS (SEPSIS)
500.0000 mL | Freq: Once | INTRAVENOUS | Status: AC
  Administered 2011-12-08: 500 mL via INTRAVENOUS

## 2011-12-08 MED ORDER — ALBUTEROL SULFATE (5 MG/ML) 0.5% IN NEBU
2.5000 mg | INHALATION_SOLUTION | Freq: Four times a day (QID) | RESPIRATORY_TRACT | Status: DC
  Administered 2011-12-09 – 2011-12-12 (×15): 2.5 mg via RESPIRATORY_TRACT
  Filled 2011-12-08 (×15): qty 0.5

## 2011-12-08 MED ORDER — INSULIN GLARGINE 100 UNIT/ML ~~LOC~~ SOLN
20.0000 [IU] | Freq: Every day | SUBCUTANEOUS | Status: DC
  Administered 2011-12-09 – 2011-12-11 (×3): 20 [IU] via SUBCUTANEOUS
  Filled 2011-12-08: qty 3

## 2011-12-08 MED ORDER — METOPROLOL TARTRATE 50 MG PO TABS
50.0000 mg | ORAL_TABLET | Freq: Two times a day (BID) | ORAL | Status: DC
  Administered 2011-12-08 – 2011-12-12 (×8): 50 mg via ORAL
  Filled 2011-12-08 (×8): qty 1

## 2011-12-08 MED ORDER — BENAZEPRIL HCL 10 MG PO TABS
10.0000 mg | ORAL_TABLET | Freq: Every day | ORAL | Status: DC
  Administered 2011-12-08 – 2011-12-12 (×5): 10 mg via ORAL
  Filled 2011-12-08 (×7): qty 1

## 2011-12-08 MED ORDER — ALBUTEROL SULFATE (5 MG/ML) 0.5% IN NEBU
2.5000 mg | INHALATION_SOLUTION | Freq: Once | RESPIRATORY_TRACT | Status: AC
  Administered 2011-12-08: 2.5 mg via RESPIRATORY_TRACT

## 2011-12-08 MED ORDER — INSULIN ASPART 100 UNIT/ML ~~LOC~~ SOLN
8.0000 [IU] | Freq: Three times a day (TID) | SUBCUTANEOUS | Status: DC
  Administered 2011-12-08 – 2011-12-09 (×2): 8 [IU] via SUBCUTANEOUS

## 2011-12-08 MED ORDER — IPRATROPIUM BROMIDE 0.02 % IN SOLN
0.5000 mg | Freq: Four times a day (QID) | RESPIRATORY_TRACT | Status: DC
  Administered 2011-12-09 – 2011-12-12 (×15): 0.5 mg via RESPIRATORY_TRACT
  Filled 2011-12-08 (×15): qty 2.5

## 2011-12-08 MED ORDER — IPRATROPIUM BROMIDE 0.02 % IN SOLN
RESPIRATORY_TRACT | Status: AC
  Administered 2011-12-08: 0.5 mg
  Filled 2011-12-08: qty 2.5

## 2011-12-08 MED ORDER — AMLODIPINE BESYLATE 5 MG PO TABS
10.0000 mg | ORAL_TABLET | Freq: Every day | ORAL | Status: DC
  Administered 2011-12-08 – 2011-12-12 (×5): 10 mg via ORAL
  Filled 2011-12-08 (×4): qty 2
  Filled 2011-12-08: qty 1

## 2011-12-08 MED ORDER — ALBUTEROL SULFATE (5 MG/ML) 0.5% IN NEBU
2.5000 mg | INHALATION_SOLUTION | Freq: Once | RESPIRATORY_TRACT | Status: AC
  Administered 2011-12-08: 2.5 mg via RESPIRATORY_TRACT
  Filled 2011-12-08: qty 0.5

## 2011-12-08 MED ORDER — ASPIRIN EC 81 MG PO TBEC
81.0000 mg | DELAYED_RELEASE_TABLET | Freq: Every day | ORAL | Status: DC
  Administered 2011-12-08 – 2011-12-12 (×5): 81 mg via ORAL
  Filled 2011-12-08 (×7): qty 1

## 2011-12-08 MED ORDER — ALLOPURINOL 100 MG PO TABS
100.0000 mg | ORAL_TABLET | Freq: Every day | ORAL | Status: DC
  Administered 2011-12-08 – 2011-12-12 (×5): 100 mg via ORAL
  Filled 2011-12-08 (×7): qty 1

## 2011-12-08 MED ORDER — DEXTROSE 5 % IV SOLN: 500.0000 mg | Freq: Once | INTRAVENOUS | Status: DC

## 2011-12-08 MED ORDER — LORAZEPAM 0.5 MG PO TABS: 0.2500 mg | ORAL_TABLET | Freq: Three times a day (TID) | ORAL | Status: DC | PRN

## 2011-12-08 MED ORDER — ACETAMINOPHEN 500 MG PO TABS
500.0000 mg | ORAL_TABLET | ORAL | Status: DC | PRN
  Administered 2011-12-11: 500 mg via ORAL
  Filled 2011-12-08: qty 1

## 2011-12-08 NOTE — ED Provider Notes (Signed)
History   This chart was scribed for Nicholes Stairs, MD by Sofie Rower. The patient was seen in room [facility] and the patient's care was started at 9:50AM.    CSN: [id number]  Arrival date & time 12/08/11  1478   First MD Initiated Contact with Patient 12/08/11 [phone]      Chief Complaint  Patient presents with  . Hyperglycemia    (Consider location/radiation/quality/duration/timing/severity/associated sxs/prior treatment) The history is provided by a caregiver.    Level 5 Caveat for immediate care intervention.  [name] is a 77 y.o. female who presents to the Emergent with  severe, constant high blood sugar onset yesterday with associated symptoms of cough . Pt. Has a history of smoking and MI. Pt. Denies pain and any production with the cough.  She is somnolent, arrousable. Hx  Provided by caregiver.     Past Medical History  Diagnosis Date  . Diabetes mellitus     insulin dependant  . Hypertension   . Hyperlipidemia   . Paroxysmal atrial fibrillation   . GERD (gastroesophageal reflux disease)   . Dementia   . Cardiac pacemaker 2003, revised 2012    implant  . Hypothyroidism   . Renal insufficiency   . Asthma   . Gastroparesis 06/2011    abnormal GES, 80% retention at two hours    Past Surgical History  Procedure Date  . Pacemaker insertion   . Appendectomy   . Hernia repair 2007    incarcerated  . Hip fracture surgery 2003    left  . Esophagogastroduodenoscopy 07/09/2011    Procedure: ESOPHAGOGASTRODUODENOSCOPY (EGD);  Surgeon: Arlyce Harman, MD;  Location: [facility];  Service: Endoscopy;  Laterality: N/A;.  Mild gastritis, schatzki ring, small hh    Family History  Problem Relation Age of Onset  . Diabetes Father   . Colon cancer Neg Hx     History  Substance Use Topics  . Smoking status: Former Games developer  . Smokeless tobacco: Never Used   Comment: Quit 20 plus years  . Alcohol Use: No    OB History    Grav Para Term Preterm  Abortions TAB SAB Ect Mult Living                  Review of Systems  Level 5 caveat. Unable to obtain from pt.    Allergies  Review of patient's allergies indicates no known allergies.  Home Medications   Current Outpatient Rx  Name Route Sig Dispense Refill  . ACETAMINOPHEN 500 MG PO TABS Oral Take 500-1,000 mg by mouth every 4 (four) hours as needed. For pain/fever     . ALLOPURINOL 100 MG PO TABS Oral Take 100 mg by mouth daily.     Marland Kitchen AMLODIPINE BESYLATE 10 MG PO TABS Oral Take 10 mg by mouth daily.     . ASPIRIN EC 81 MG PO TBEC Oral Take 81 mg by mouth daily.      . ATORVASTATIN CALCIUM 20 MG PO TABS Oral Take 20 mg by mouth at bedtime.     . B COMPLEX-C PO TABS Oral Take 1 tablet by mouth daily.      Marland Kitchen BENAZEPRIL HCL 10 MG PO TABS Oral Take 10 mg by mouth daily.      . DONEPEZIL HCL 5 MG PO TABS Oral Take 5 mg by mouth at bedtime.     . INSULIN ISOPHANE & REGULAR (70-30) 100 UNIT/ML [place] SUSP Subcutaneous Inject 10 Units into the  skin 2 (two) times daily. Inject 10 units twice daily at 0800 and 1700     . LEVOTHYROXINE SODIUM 50 MCG PO TABS Oral Take 50 mcg by mouth daily.     Marland Kitchen LORAZEPAM 0.5 MG PO TABS Oral Take 0.25 mg by mouth 3 (three) times daily.     Marland Kitchen LORAZEPAM 0.5 MG PO TABS Oral Take 0.25 mg by mouth every 8 (eight) hours as needed. For agitation     . METOPROLOL TARTRATE 50 MG PO TABS Oral Take 50 mg by mouth 2 (two) times daily.     Marland Kitchen OMEPRAZOLE 20 MG PO CPDR Oral Take 20 mg by mouth daily before breakfast.      . POLYETHYLENE GLYCOL 3350 GRAN Oral Take 17 g by mouth daily. 527 g 5  . VITAMIN B-12 1000 MCG PO TABS Oral Take 2,000 mcg by mouth daily.      . ERGOCALCIFEROL 50000 UNITS PO CAPS Oral Take 50,000 Units by mouth once a week. Take one capsule on Thursdays    . ONDANSETRON HCL 8 MG PO TABS Oral Take 8 mg by mouth every 6 (six) hours as needed. For nausea      BP 124/64  Pulse 64  Temp(Src) 98.8 F (37.1 C) (Oral)  Resp 18  SpO2 72%  Physical Exam    Nursing note and vitals reviewed. Constitutional: She appears well-developed and well-nourished. She appears distressed.  HENT:  Head: Normocephalic and atraumatic.  Neck: Neck supple. No tracheal deviation present.  Cardiovascular: Normal rate.   No murmur heard. Pulmonary/Chest: Effort normal. No respiratory distress. She has rales (Bilaterally.).  Abdominal: Soft. She exhibits no distension. There is no tenderness.  Musculoskeletal: Normal range of motion. She exhibits no edema.  Neurological: No sensory deficit.       arousable  Skin: Skin is warm and dry.    ED Course  Procedures (including critical care time) 77 year old, female, brought to the emergency department, because she's had a cough, and elevated blood sugars.  She denies pain anywhere.  On examination.  She is arousable and speaks clearly, but her voices barely perceptible.  She denies pain anywhere.  She got rales bilaterally.  On examination, and had initial oxygen saturations in the 60.  We'll perform a chest x-ray, and laboratory testing, and treat her hyperglycemia. DIAGNOSTIC STUDIES: Oxygen Saturation is 72% on [place], low by my interpretation.    COORDINATION OF CARE:   Results for orders placed during the hospital encounter of 12/08/11  BASIC METABOLIC PANEL      Component Value Range   Sodium 118 (*) 135 - 145 (mEq/L)   Potassium 3.9  3.5 - 5.1 (mEq/L)   Chloride 87 (*) 96 - 112 (mEq/L)   CO2 23  19 - 32 (mEq/L)   Glucose, Bld 361 (*) 70 - 99 (mg/dL)   BUN 41 (*) 6 - 23 (mg/dL)   Creatinine, Ser 5.40 (*) 0.50 - 1.10 (mg/dL)   Calcium 8.4  8.4 - 98.1 (mg/dL)   GFR calc non Af Amer 35 (*) >90 (mL/min)   GFR calc Af Amer 40 (*) >90 (mL/min)  GLUCOSE, CAPILLARY      Component Value Range   Glucose-Capillary 528 (*) 70 - 99 (mg/dL)   Comment 1 Call MD NNP PA CNM    CBC      Component Value Range   WBC 5.3  4.0 - 10.5 (K/uL)   RBC 3.49 (*) 3.87 - 5.11 (MIL/uL)   Hemoglobin 10.5 (*) 12.0 -  15.0 (g/dL)    HCT 04.5 (*) 40.9 - 46.0 (%)   MCV 88.8  78.0 - 100.0 (fL)   MCH 30.1  26.0 - 34.0 (pg)   MCHC 33.9  30.0 - 36.0 (g/dL)   RDW 81.1  91.4 - 78.2 (%)   Platelets 252  150 - 400 (K/uL)  DIFFERENTIAL      Component Value Range   Neutrophils Relative 79 (*) 43 - 77 (%)   Neutro Abs 4.1  1.7 - 7.7 (K/uL)   Lymphocytes Relative 13  12 - 46 (%)   Lymphs Abs 0.7  0.7 - 4.0 (K/uL)   Monocytes Relative 8  3 - 12 (%)   Monocytes Absolute 0.4  0.1 - 1.0 (K/uL)   Eosinophils Relative 0  0 - 5 (%)   Eosinophils Absolute 0.0  0.0 - 0.7 (K/uL)   Basophils Relative 0  0 - 1 (%)   Basophils Absolute 0.0  0.0 - 0.1 (K/uL)  GLUCOSE, CAPILLARY      Component Value Range   Glucose-Capillary 411 (*) 70 - 99 (mg/dL)  GLUCOSE, CAPILLARY      Component Value Range   Glucose-Capillary 315 (*) 70 - 99 (mg/dL)   Comment 1 Documented in Chart     Comment 2 Notify RN     Comment 3 Glucose Stablizer     Dg Chest Portable 1 View  12/08/2011  *RADIOLOGY REPORT*  Clinical Data: Short of breath.Cough.  PORTABLE CHEST - 1 VIEW  Comparison: 03/07/2011.  Findings: Unchanged right subclavian cardiac pacemaker.  There is patchy airspace disease in the lower lobes bilaterally.  This may represent aspiration or multifocal pneumonia.  Pulmonary edema is also possible.  No pleural effusion is identified.  IMPRESSION: Patchy bilateral basilar airspace disease which may represent aspiration or multifocal pneumonia.  Pulmonary edema is another possibility.  Original Report Authenticated By: Andreas Newport, M.D.   ED ECG REPORT   Date: 12/08/2011  EKG Time: 1:52 PM  Rate: 60   Rhythm: ventricular paced,    Axis: left  Intervals:none  ST&T Change: none  Narrative Interpretation: ventricular paced          2:14 PM Spoke with the md on call for Dr. Sudie Bailey. He will admit for further tx.  CRITICAL CARE Performed by: Nicholes Stairs   Total critical care time: 30 min Oxygen and nebs for hypoxia abxs for  pneumonia IVF for hyponatremia IV insulin for hyperglycemia KCl for K+ support due to insulin gtt  Critical care time was exclusive of separately billable procedures and treating other patients.  Critical care was necessary to treat or prevent imminent or life-threatening deterioration.  Critical care was time spent personally by me on the following activities: development of treatment plan with patient and/or surrogate as well as nursing, discussions with consultants, evaluation of patient's response to treatment, examination of patient, obtaining history from patient or surrogate, ordering and performing treatments and interventions, ordering and review of laboratory studies, ordering and review of radiographic studies, pulse oximetry and re-evaluation of patient's condition.      MDM  CAP- with room air hypoxia hyperglycemia   9:50AM- EDP at bedside discusses treatment plan.   I personally performed the services described in this documentation, which was scribed in my presence. The recorded information has been reviewed and considered.       Nicholes Stairs, MD 12/08/11 1415

## 2011-12-08 NOTE — ED Notes (Signed)
Insulin gtt adjusted per glucose stabilzer to 2.6cc/hr

## 2011-12-08 NOTE — ED Notes (Signed)
Lab in room for bloodwork 

## 2011-12-08 NOTE — ED Notes (Signed)
Pt has had blood sugar in the 400's since yesterday per caregiver. States that she has not been eating much.

## 2011-12-08 NOTE — ED Notes (Signed)
Patient was found with no Oxygen in her oxygen tank so she was switched over to the wall oxygen. Patients O2 stat went from 64% to 91%. RN Gearldine Bienenstock notified.

## 2011-12-08 NOTE — ED Notes (Signed)
Placed pt on 15L NRB, pulse ox now 97%.

## 2011-12-08 NOTE — Plan of Care (Signed)
Problem: Consults Goal: Diagnosis-Diabetes Mellitus Hyperglycemia     

## 2011-12-08 NOTE — ED Notes (Signed)
Due to Insulin IV and Zithromax IV being incompatible, pt has been stuck for second IV access multiple attempts with no success.  edp notified.

## 2011-12-08 NOTE — H&P (Signed)
  [id number] 

## 2011-12-08 NOTE — H&P (Signed)
[name], [name]            ACCOUNT NO.:  [id number]  MEDICAL RECORD NO.:  [id number]  LOCATION:  IC09                          FACILITY:  [facility]  PHYSICIAN:  Purcell Nails, MD DATE OF BIRTH:  September 27, 1925  DATE OF ADMISSION:  12/08/2011 DATE OF DISCHARGE:  LH                             HISTORY & PHYSICAL   CHIEF COMPLAINT:  Cough, hyperglycemia, weakness.  [name] is an 77 year old assisted living facility resident with multiple medical problems including type 2 diabetes, hypertension, paroxysmally atrial fibrillation, dementia, hypothyroidism, hyperlipidemia.  She was brought in by her caregiver to [facility] with a complaint of persistent high blood sugar since yesterday.  She was also noted to have cough, which was not reported to be productive.  She has prior smoking history.  The patient was subjected to workup with a chest x-ray, which showed pneumonic process. She was admitted with a diagnosis of pneumonia, hyponatremia, and hyperglycemia.  She is not a reliable historian.  Her history is obtained by chart review.  No caregiver is available at this point.  PAST MEDICAL HISTORY:  As above plus asthma, renal insufficiency, cardiac pacemaker implant.  PAST SURGICAL HISTORY:  Pacemaker insertion, appendectomy, hernia repair, hip fracture, status post surgical repair, EGD.  FAMILY HISTORY:  Significant for diabetes and colon cancer.  SOCIAL HISTORY:  Former smoker.  No current use of alcohol, tobacco, or drugs.  REVIEW OF SYSTEMS:  Unobtainable.  HOME MEDICATIONS:  Tylenol, allopurinol, amlodipine 10 mg p.o. daily, aspirin 81 mg daily, atorvastatin 20 mg daily, B complex, benazepril 10 mg daily, Aricept 5 mg daily, NPH 10 units twice a day, levothyroxine 50 mcg by mouth daily, lorazepam, metoprolol, omeprazole, polyethylene glycol, Vitamin B12 level, ergocalciferol, and ondansetron.  PHYSICAL EXAMINATION:  GENERAL: She is chronically sick  looking on oxygen per nasal cannula.  Oxygen saturation at 100%, not in acute distress. HEENT.  No pallor.  No icterus. NECK:  Veins are slightly congested with no pulsation. RESPIRATORY:  Significant for bilateral rales and dull to percussion at bases. CARDIOVASCULAR:  Distant heart sounds.  No murmur.  No gallop. ABDOMEN:  Soft and nontender. EXTREMITIES:  No edema. CNS:  Oriented to self, not oriented to time and place.  She moves all extremities to pain, withdraws.  Her lab work showed sodium 118, potassium 3.9, chloride 87, bicarb 23, BUN 41, creatinine 1.3, calcium 8.4, GFR at 4.  WBC 5.3, hemoglobin 10.5, platelet count 252,000.  Chest x-ray showed patchy bilateral basilar airspace disease, which may present aspiration or multifocal pneumonia, pulmonary edema not ruled out.  ASSESSMENT: 1. Pneumonia. 2. Hyperglycemia consistent with uncontrolled type 2 diabetes. 3. Severe hyponatremia. 4. Deconditioning with dementia. 5. Type 2 diabetes. 6. Hypothyroidism. 7. Chronic renal insufficiency. 8. Hypertension.  PLAN:  The patient will be admitted to the ICU bed for monitoring.  We will provide with antibiotic coverage involving ceftriaxone and azithromycin.  She will be given a dose of Lasix IV with a Foley catheter insertion for gentle diuresis.  We will restrict her fluid intake to less than 800 mL for 24 hours, and monitor her sodium level closely.  The duration of hyponatremia is unknown in this patient, unlikely to  be acute and unlikely to be secondary to the acute hyperglycemia.  However, sodium needs to be treated cautiously to low normal range of 125-135.  Transient SIADH due to pneumonia is a possibility.  We will continue supportive care with one-to-one assistance with her meals and continued home medications revised and reconciled.  She will be given DuoNeb treatments every 4 hours while awake and will be given DVT prophylaxis.  We will obtain her A1c and TSH,  free T4 with her next blood draw.  We will titrate her oxygen above 90% using nasal cannula or 100% nonrebreather if necessary.                                           ______________________________ Purcell Nails, MD     GN/MEDQ  D:  12/08/2011  T:  12/08/2011  Job:  [id number]

## 2011-12-08 NOTE — ED Notes (Signed)
EDP informed of pt's Na level and no new orders given at this time

## 2011-12-08 NOTE — ED Notes (Signed)
Patient does not need anything at this time. CBG 221 mg/dL

## 2011-12-09 ENCOUNTER — Encounter (HOSPITAL_COMMUNITY): Payer: Self-pay | Admitting: *Deleted

## 2011-12-09 LAB — BASIC METABOLIC PANEL
CO2: 26 mEq/L (ref 19–32)
CO2: 27 mEq/L (ref 19–32)
CO2: 27 mEq/L (ref 19–32)
Calcium: 8.4 mg/dL (ref 8.4–10.5)
Calcium: 8.4 mg/dL (ref 8.4–10.5)
Calcium: 8.5 mg/dL (ref 8.4–10.5)
Chloride: 90 mEq/L — ABNORMAL LOW (ref 96–112)
Chloride: 90 mEq/L — ABNORMAL LOW (ref 96–112)
Chloride: 95 mEq/L — ABNORMAL LOW (ref 96–112)
Creatinine, Ser: 1.39 mg/dL — ABNORMAL HIGH (ref 0.50–1.10)
GFR calc Af Amer: 37 mL/min — ABNORMAL LOW (ref >90)
GFR calc Af Amer: 39 mL/min — ABNORMAL LOW (ref >90)
Glucose, Bld: 168 mg/dL — ABNORMAL HIGH (ref 70–99)
Potassium: 4.2 mEq/L (ref 3.5–5.1)
Sodium: 125 mEq/L — ABNORMAL LOW (ref 135–145)
Sodium: 126 mEq/L — ABNORMAL LOW (ref 135–145)
Sodium: 128 mEq/L — ABNORMAL LOW (ref 135–145)

## 2011-12-09 LAB — HEMOGLOBIN A1C
Hgb A1c MFr Bld: 7.9 % — ABNORMAL HIGH (ref <5.7)
Mean Plasma Glucose: 180 mg/dL — ABNORMAL HIGH (ref <117)

## 2011-12-09 LAB — GLUCOSE, CAPILLARY
Glucose-Capillary: 126 mg/dL — ABNORMAL HIGH (ref 70–99)
Glucose-Capillary: 128 mg/dL — ABNORMAL HIGH (ref 70–99)

## 2011-12-09 LAB — T4, FREE: Free T4: 1.4 ng/dL (ref 0.80–1.80)

## 2011-12-09 MED ORDER — INSULIN ASPART 100 UNIT/ML ~~LOC~~ SOLN
8.0000 [IU] | Freq: Three times a day (TID) | SUBCUTANEOUS | Status: DC
  Administered 2011-12-10 – 2011-12-11 (×3): 8 [IU] via SUBCUTANEOUS

## 2011-12-09 MED ORDER — ENOXAPARIN SODIUM 30 MG/0.3ML ~~LOC~~ SOLN
30.0000 mg | SUBCUTANEOUS | Status: DC
Start: 2011-12-09 — End: 2011-12-12
  Administered 2011-12-09 – 2011-12-11 (×3): 30 mg via SUBCUTANEOUS
  Filled 2011-12-09 (×3): qty 0.3

## 2011-12-09 MED ORDER — BIOTENE DRY MOUTH MT LIQD
15.0000 mL | Freq: Three times a day (TID) | OROMUCOSAL | Status: DC
  Administered 2011-12-09 – 2011-12-12 (×12): 15 mL via OROMUCOSAL

## 2011-12-09 MED ORDER — INSULIN ASPART 100 UNIT/ML ~~LOC~~ SOLN
0.0000 [IU] | Freq: Three times a day (TID) | SUBCUTANEOUS | Status: DC
  Administered 2011-12-09 – 2011-12-11 (×4): 2 [IU] via SUBCUTANEOUS

## 2011-12-09 MED ORDER — SODIUM CHLORIDE 0.9 % IJ SOLN
INTRAMUSCULAR | Status: AC
  Administered 2011-12-09: 10 mL
  Filled 2011-12-09: qty 3

## 2011-12-09 NOTE — Progress Notes (Signed)
Report given to T. Corine Shelter, RN. Pt alert, oriented and in stable condition at the time of transport. Pt transported on oxygen in bed to room 319.

## 2011-12-09 NOTE — Progress Notes (Signed)
Estimated Creatinine Clearance: 26.9 ml/min (by C-G formula based on Cr of 1.4). Weight = 67.2 kg  Adjusted Lovenox to 30mg  SQ every 24 hours for VTE prophylaxis due to renal function.  [name] 12/09/2011 12:54 PM

## 2011-12-10 LAB — BASIC METABOLIC PANEL
Calcium: 8.9 mg/dL (ref 8.4–10.5)
Creatinine, Ser: 1.42 mg/dL — ABNORMAL HIGH (ref 0.50–1.10)
GFR calc Af Amer: 38 mL/min — ABNORMAL LOW (ref >90)
Sodium: 131 mEq/L — ABNORMAL LOW (ref 135–145)

## 2011-12-10 LAB — GLUCOSE, CAPILLARY
Glucose-Capillary: 109 mg/dL — ABNORMAL HIGH (ref 70–99)
Glucose-Capillary: 64 mg/dL — ABNORMAL LOW (ref 70–99)

## 2011-12-10 MED ORDER — SODIUM CHLORIDE 0.9 % IJ SOLN
INTRAMUSCULAR | Status: AC
  Filled 2011-12-10: qty 3

## 2011-12-10 NOTE — Progress Notes (Signed)
NAMEBLESSINGS, INGLETT            ACCOUNT NO.:  [id number]  MEDICAL RECORD NO.:  [id number]  LOCATION:  A319                          FACILITY:  [facility]  PHYSICIAN:  Mila Homer. Sudie Bailey, M.D.DATE OF BIRTH:  1925/11/27  DATE OF PROCEDURE:  12/10/2011 DATE OF DISCHARGE:                                PROGRESS NOTE   SUBJECTIVE:  She feels somewhat better.  She tells me she has had pneumonia several times.  She also tells me that after eating she tends to cough.  OBJECTIVE:  VITAL SIGNS: Temp is 98.4, pulse 70, respiratory 16, and blood pressure 113/68. GENERAL: She is semirecumbent bed.  She is well-developed, well- nourished, appears to be alert.  Speech is normal, and she seems to give a good history. LUNGS: She does have expiratory wheezing throughout her lungs, however. There are no intercostal retractions or use of accessory muscles of respiration. CARDIOVASCULAR: Heart has a regular rhythm. Rate of about 80. GASTROINTESTINAL: Abdomen is soft without organomegaly or mass or tenderness. EXTREMITIES: There is no edema of the ankles.  ASSESSMENT: 1. Presumptive aspiration pneumonia. 2. Hyponatremia. 3. Hyperkalemia. 4. Diabetes mellitus. 5. Hypothyroidism  PLAN:  Continue ceftriaxone 1 g IV daily with azithromycin 500 mg p.o. daily.  Continue coverage of her diabetes with sliding scale insulin, and hypothyroidism levothyroxine.  She will be seen by a Dr. Shaune Pollack, on call for me tomorrow.  We will recheck her BMP in the morning.     Mila Homer. Sudie Bailey, M.D.     SDK/MEDQ  D:  12/10/2011  T:  12/10/2011  Job:  [id number]

## 2011-12-10 NOTE — Progress Notes (Signed)
[name], [name]            ACCOUNT NO.:  [id number]  MEDICAL RECORD NO.:  [id number]  LOCATION:  A319                          FACILITY:  [facility]  PHYSICIAN:  Zamorah Ailes D. Felecia Shelling, MD   DATE OF BIRTH:  Oct 29, 1925  DATE OF PROCEDURE:  12/09/2011 DATE OF DISCHARGE:                                PROGRESS NOTE   SUBJECTIVE:  The patient feels much better.  She has no specific complaints.  OBJECTIVE:  GENERAL:  The patient is alert, awake, and responds to verbal communication. VITAL SIGNS:  Blood pressure 114/43, pulse 64, respiratory rate 18, and temperature 97 degrees Fahrenheit. CHEST:  Decreased air entry, few rhonchi. CARDIOVASCULAR:  First and second heart sounds heard.  No murmur.  No gallop. ABDOMEN:  Soft and lax.  Bowel sound is positive.  No mass or organomegaly. EXTREMITIES:  No leg edema.  LABORATORY DATA:  Sodium 126, potassium 4.2, chloride 90, carbon dioxide 27, glucose 168, BUN 34, creatinine 1.4, and calcium 8.4.  ASSESSMENT: 1. Pneumonia. 2. Hyponatremia. 3. Dementia. 4. Diabetes mellitus. 5. Hypothyroidism.  PLAN:  Continue the patient on combination IV antibiotics.  Continue regular medications.  Continue slow hydration.  Continue supportive care.     [name] D. Felecia Shelling, MD     TDF/MEDQ  D:  12/09/2011  T:  12/10/2011  Job:  [id number]

## 2011-12-11 LAB — GLUCOSE, CAPILLARY
Glucose-Capillary: 150 mg/dL — ABNORMAL HIGH (ref 70–99)
Glucose-Capillary: 147 mg/dL — ABNORMAL HIGH (ref 70–99)
Glucose-Capillary: 47 mg/dL — ABNORMAL LOW (ref 70–99)

## 2011-12-11 LAB — BASIC METABOLIC PANEL
BUN: 26 mg/dL — ABNORMAL HIGH (ref 6–23)
CO2: 25 mEq/L (ref 19–32)
Calcium: 8.9 mg/dL (ref 8.4–10.5)
Chloride: 96 mEq/L (ref 96–112)
Creatinine, Ser: 1.21 mg/dL — ABNORMAL HIGH (ref 0.50–1.10)
Glucose, Bld: 174 mg/dL — ABNORMAL HIGH (ref 70–99)

## 2011-12-11 MED ORDER — CEFUROXIME AXETIL 250 MG PO TABS
500.0000 mg | ORAL_TABLET | Freq: Two times a day (BID) | ORAL | Status: DC
  Administered 2011-12-11 – 2011-12-12 (×2): 500 mg via ORAL
  Filled 2011-12-11 (×2): qty 2

## 2011-12-11 MED ORDER — GLUCOSE 40 % PO GEL
ORAL | Status: AC
  Administered 2011-12-11: 18:00:00
  Filled 2011-12-11: qty 0.83

## 2011-12-11 MED ORDER — SODIUM POLYSTYRENE SULFONATE 15 GM/60ML PO SUSP
60.0000 g | Freq: Once | ORAL | Status: AC
  Administered 2011-12-11: 60 g via ORAL
  Filled 2011-12-11: qty 60

## 2011-12-11 MED ORDER — SODIUM CHLORIDE 0.9 % IJ SOLN
INTRAMUSCULAR | Status: AC
  Administered 2011-12-11: 18:00:00
  Filled 2011-12-11: qty 3

## 2011-12-11 MED ORDER — SODIUM CHLORIDE 0.9 % IJ SOLN
INTRAMUSCULAR | Status: AC
  Administered 2011-12-11: 13:00:00
  Filled 2011-12-11: qty 3

## 2011-12-11 MED ORDER — SODIUM CHLORIDE 0.9 % IJ SOLN
INTRAMUSCULAR | Status: AC
  Administered 2011-12-11: 17:00:00
  Filled 2011-12-11: qty 3

## 2011-12-11 NOTE — Progress Notes (Signed)
Patient's IV came out during the night.  IV restart was attempted by several different nurses.  No Iv access was accomplished.  Notifed MD of situation, will leave IV out for now and have further attempt by day shift.

## 2011-12-12 LAB — BASIC METABOLIC PANEL
BUN: 17 mg/dL (ref 6–23)
Calcium: 8.8 mg/dL (ref 8.4–10.5)
Chloride: 100 mEq/L (ref 96–112)
Creatinine, Ser: 1.02 mg/dL (ref 0.50–1.10)
GFR calc Af Amer: 57 mL/min — ABNORMAL LOW (ref >90)

## 2011-12-12 LAB — GLUCOSE, CAPILLARY
Glucose-Capillary: 118 mg/dL — ABNORMAL HIGH (ref 70–99)
Glucose-Capillary: 49 mg/dL — ABNORMAL LOW (ref 70–99)

## 2011-12-12 MED ORDER — CEFUROXIME AXETIL 500 MG PO TABS: 500.0000 mg | ORAL_TABLET | Freq: Two times a day (BID) | ORAL | Status: AC

## 2011-12-12 NOTE — Progress Notes (Signed)
Pt d/c today by MD back to [facility].  Pt and [facility] aware and agreeable.  Inetta Fermo, RN reviewed [facility] for accuracy.  MD gave order for no sliding scale insulin and for Ativan to be given TID.  Progress note with order in packet for facility.  Pt to transfer via facility van.  Karn Cassis

## 2011-12-12 NOTE — Progress Notes (Signed)
MD notified of CBG, insulin held per [facility] MD. Will continue to monitor.

## 2011-12-12 NOTE — Progress Notes (Signed)
Utilization review completed.  

## 2011-12-12 NOTE — Progress Notes (Addendum)
Orders for discharge medications given for Ativan 0.25mg  PO TID scheduled and Novolog 70/30 10 units SQ BID and to D/C sliding scale insulin by Dr. Sudie Bailey.

## 2011-12-12 NOTE — Progress Notes (Signed)
NAMEJEREE, DELCID            ACCOUNT NO.:  [id number]  MEDICAL RECORD NO.:  [id number]  LOCATION:  A319                          FACILITY:  [facility]  PHYSICIAN:  Judye Lorino L. Juanetta Gosling, M.D.DATE OF BIRTH:  August 24, 1925  DATE OF PROCEDURE:  12/11/2011 DATE OF DISCHARGE:  12/12/2011                                PROGRESS NOTE   Patient of Dr. Sudie Bailey.  Ms. Campus was admitted with pneumonia.  She has unfortunately lost IV access.  She is awake and alert however.  She has had multiple IV sticks and has been unable to get an IV established.  She has been off of her IV for about 12 hours and looks pretty stable at this point.  PHYSICAL EXAMINATION:  GENERAL:  Shows that she is awake and alert, mildly confused, I expect that she is probably at her baseline mental status. VITAL SIGNS:  Her temperature is 98.6, pulse 61, respirations 16, blood pressure 122/66, O2 sats 95%. CHEST:  Relatively clear.  Her potassium is 5.9, it has been up throughout most of her hospitalization, BUN is 26, creatinine 1.21.  Assessment is that she is better.  PLAN:  Continue with her current treatments.  I am going to add Kayexalate.  I am going to switch her to p.o. meds since we do not have IV access, and I am not sure that we can get a PICC line today.  So I will switch her to p.o. meds and see how she does.  She does seem to be pretty stable at this point.  If she has any clinical deterioration, she may need a central venous line.     Yanelli Zapanta L. Juanetta Gosling, M.D.     ELH/MEDQ  D:  12/12/2011  T:  12/12/2011  Job:  [id number]

## 2011-12-12 NOTE — Discharge Summary (Signed)
[name], [name]            ACCOUNT NO.:  [id number]  MEDICAL RECORD NO.:  [id number]  LOCATION:  A319                          FACILITY:  [facility]  PHYSICIAN:  Mila Homer. Sudie Bailey, M.D.DATE OF BIRTH:  05/09/1925  DATE OF ADMISSION:  12/08/2011 DATE OF DISCHARGE:  12/26/2012LH                              DISCHARGE SUMMARY   HISTORY:  This 77 year old was admitted to the hospital with pneumonia. She had a benign 5 day hospitalization extending from December 22  to December 12, 2011.  Vital signs remained stable.  Admission white cell count was 5300, hemoglobin 10.5.  She had 79% neutrophils, 13 lymphs on her differential.  Her sodium was 118 on admission, chloride 87, BUN 41, creatinine 1.36. Glucose is 361.  Point of care glucose on admission through the emergency room was 528, recheck 411; it gradually dropped to 200 range.  UA was negative except for sugar.  Admission chest x-ray showed patchy bilateral bibasilar airspace disease; was felt to represent aspiration or multifocal pneumonia.  A 12 lead EKG showed a ventricular pacemaker.  No significant change from her prior EKG.  Serum sodium gradually improved and stabilized out around 130.  BUN was 17 and creatinine 1.02 at the time of discharge.  Sugars were in the 100 range.  TSH is 1.344.  She was admitted to the hospital, and treated with ceftriaxone.  She was continued on outpatient meds including lorazepam, albuterol, allopurinol, amlodipine, aspirin, benazepril, Donepezil, NovoLog insulin, Lantus insulin, levothyroxine, metoprolol, pantoprazole, simvastatin, vitamin B12, vitamin D.  She was put on prophylactic Lovenox.  She did well on this regimen.  Her IV came out the day before discharge and she was switched to cefuroxime 500 mg b.i.d., and continued to do well with this.  I talked to her in the hospital, and she admitted that she would have coughing spells after eating, suggestive that she really had  some aspiration causing the pneumonia.  DISCHARGE MEDICATIONS: 1. Acetaminophen 5 mg per 1000 mg q.4 hours for pain or fever 2. Allopurinol 100 mg daily 3. Amlodipine 10 mg daily 4. Enteric-coated aspirin 81 mg daily 5. Atorvastatin 20 mg at bedtime 6. B complex with C tablet daily 7. Benazepril 10 mg daily 8. Donepezil 5 mg at bedtime 9. Novolin 70/30 insulin 10 units b.i.d. 10.Levothyroxine 50 mcg daily 11.Lorazepam 0.5 mg t.i.d. 12.Metoprolol 50 mg b.i.d. 13.Omeprazole 20 mg every morning 14.Polyethylene glycol 17 g in water daily 15.Vitamin B12 1000 mcg 2 tablets daily. 16.She is also taking vitamin D 50,000 units weekly. 17.She was put on cefuroxime 500 mg b.i.d. for a 7 day course. 18.She is also on NovoLog sliding scale. 19.Ondansetron 8 mg q.6 hours for nausea. She is to return to [facility] after leaving the hospital.     Mila Homer. Sudie Bailey, M.D.     SDK/MEDQ  D:  12/12/2011  T:  12/12/2011  Job:  [id number]

## 2011-12-12 NOTE — Progress Notes (Signed)
Report called to Mrs Clinton Sawyer, assisted living facility coordinator. Patient transported by CNA, Diane. Patient escorted out by staff, transported by facility.

## 2012-05-16 ENCOUNTER — Other Ambulatory Visit (HOSPITAL_COMMUNITY): Payer: Self-pay | Admitting: Family Medicine

## 2012-05-16 DIAGNOSIS — Z139 Encounter for screening, unspecified: Secondary | ICD-10-CM

## 2012-05-22 ENCOUNTER — Ambulatory Visit (HOSPITAL_COMMUNITY)
Admission: RE | Admit: 2012-05-22 | Discharge: 2012-05-22 | Disposition: A | Discharge status: Home / Self Care | Payer: Medicare Other | Source: Ambulatory Visit | Attending: Family Medicine | Admitting: Family Medicine

## 2012-05-22 DIAGNOSIS — Z1231 Encounter for screening mammogram for malignant neoplasm of breast: Secondary | ICD-10-CM | POA: Insufficient documentation

## 2012-05-22 DIAGNOSIS — Z139 Encounter for screening, unspecified: Secondary | ICD-10-CM

## 2012-05-23 ENCOUNTER — Encounter: Payer: Self-pay | Admitting: Internal Medicine

## 2012-05-23 ENCOUNTER — Ambulatory Visit (INDEPENDENT_AMBULATORY_CARE_PROVIDER_SITE_OTHER): Payer: Medicare Other | Admitting: Internal Medicine

## 2012-05-23 VITALS — BP 110/68 | HR 64 | Ht 65.0 in | Wt 144.1 lb

## 2012-05-23 DIAGNOSIS — R001 Bradycardia, unspecified: Secondary | ICD-10-CM

## 2012-05-23 DIAGNOSIS — I498 Other specified cardiac arrhythmias: Secondary | ICD-10-CM

## 2012-05-23 DIAGNOSIS — Z950 Presence of cardiac pacemaker: Secondary | ICD-10-CM

## 2012-05-23 DIAGNOSIS — I4891 Unspecified atrial fibrillation: Secondary | ICD-10-CM

## 2012-05-23 LAB — PACEMAKER DEVICE OBSERVATION
AL AMPLITUDE: 5.6 mv
ATRIAL PACING PM: 82
BAMS-0001: 175 {beats}/min
BRDY-0002RV: 60 {beats}/min
BRDY-0004RV: 130 {beats}/min
RV LEAD THRESHOLD: 0.5 V

## 2012-05-23 NOTE — Assessment & Plan Note (Signed)
Her underlying atrial rhythm is atrial fibrillation. Her ventricular rates are well controlled. She is not anticoagulation candidate secondary to her history of dementia and GI problems.

## 2012-05-23 NOTE — Assessment & Plan Note (Signed)
Her device is working normally. Her underlying rhythm is atrial fibrillation with ventricular pacing. We'll recheck her device in several months.

## 2012-05-23 NOTE — Progress Notes (Signed)
HPI [name] returns today for followup. She is a very pleasant 78 year old woman with a history of atrial fibrillation and complete heart block. She has hypertension. She is status post permanent pacemaker insertion. She denies chest pain or shortness of breath. No syncope. She has minimal peripheral edema. No Known Allergies   Current Outpatient Prescriptions  Medication Sig Dispense Refill  . acetaminophen (TYLENOL) 500 MG tablet Take 500-1,000 mg by mouth every 4 (four) hours as needed. For pain/fever       . allopurinol (ZYLOPRIM) 100 MG tablet Take 100 mg by mouth daily.       Marland Kitchen amLODipine (NORVASC) 10 MG tablet Take 10 mg by mouth daily.       Marland Kitchen aspirin EC 81 MG tablet Take 81 mg by mouth daily.        Marland Kitchen atorvastatin (LIPITOR) 20 MG tablet Take 20 mg by mouth at bedtime.       . B Complex-C (B-COMPLEX WITH VITAMIN C) tablet Take 1 tablet by mouth daily.        . benazepril (LOTENSIN) 10 MG tablet Take 10 mg by mouth daily.        Marland Kitchen donepezil (ARICEPT) 5 MG tablet Take 5 mg by mouth at bedtime.       . ergocalciferol (VITAMIN D2) 50000 UNITS capsule Take 50,000 Units by mouth once a week. Take one capsule on Thursdays      . insulin NPH-insulin regular (NOVOLIN 70/30) (70-30) 100 UNIT/ML injection Inject 10 Units into the skin 2 (two) times daily. Inject 10 units twice daily at 0800 and 1700       . levothyroxine (SYNTHROID, LEVOTHROID) 50 MCG tablet Take 50 mcg by mouth daily.       Marland Kitchen LORazepam (ATIVAN) 0.5 MG tablet Take 0.25 mg by mouth 3 (three) times daily.       . metoprolol (LOPRESSOR) 50 MG tablet Take 50 mg by mouth 2 (two) times daily.       Marland Kitchen omeprazole (PRILOSEC) 20 MG capsule Take 20 mg by mouth daily before breakfast.        . ondansetron (ZOFRAN) 8 MG tablet Take 8 mg by mouth every 6 (six) hours as needed. For nausea      . Polyethylene Glycol 3350 GRAN Take 17 g by mouth daily.  527 g  5  . vitamin B-12 (CYANOCOBALAMIN) 1000 MCG tablet Take 2,000 mcg by mouth daily.            Past Medical History  Diagnosis Date  . Diabetes mellitus     insulin dependant  . Hypertension   . Hyperlipidemia   . Paroxysmal atrial fibrillation   . GERD (gastroesophageal reflux disease)   . Dementia   . Cardiac pacemaker 2003, revised 2012    implant  . Hypothyroidism   . Renal insufficiency   . Asthma   . Gastroparesis 06/2011    abnormal GES, 80% retention at two hours    ROS:   All systems reviewed and negative except as noted in the HPI.   Past Surgical History  Procedure Date  . Pacemaker insertion   . Appendectomy   . Hernia repair 2007    incarcerated  . Hip fracture surgery 2003    left  . Esophagogastroduodenoscopy 07/09/2011    Procedure: ESOPHAGOGASTRODUODENOSCOPY (EGD);  Surgeon: Arlyce Harman, MD;  Location: [facility];  Service: Endoscopy;  Laterality: N/A;.  Mild gastritis, schatzki ring, small hh     Family History  Problem Relation Age of Onset  . Diabetes Father   . Colon cancer Neg Hx      History   Social History  . Marital Status: Widowed    Spouse Name: N/A    Number of Children: N/A  . Years of Education: N/A   Occupational History  . retired     living in assisted living, [place]  .      retired [employer]   Social History Main Topics  . Smoking status: Former Games developer  . Smokeless tobacco: Never Used   Comment: Quit 20 plus years  . Alcohol Use: No  . Drug Use: No  . Sexually Active: Not on file   Other Topics Concern  . Not on file   Social History Narrative   Sister, Radio broadcast assistant.     BP 110/68  Pulse 64  Ht 5\' 5"  (1.651 m)  Wt 144 lb 1.9 oz (65.372 kg)  BMI 23.98 kg/m2  Physical Exam:  Well appearing elderly woman, NAD HEENT: Unremarkable Neck:  No JVD, no thyromegally Lungs:  Clear with no wheezes, rales, or rhonchi. Well-healed pacemaker incision. HEART:  Regular rate rhythm, no murmurs, no rubs, no clicks Abd:  soft, positive bowel sounds, no organomegally, no  rebound, no guarding Ext:  2 plus pulses, no edema, no cyanosis, no clubbing Skin:  No rashes no nodules Neuro:  CN II through XII intact, motor grossly intact  DEVICE  Normal device function.  See PaceArt for details.   Assess/Plan:

## 2012-05-23 NOTE — Patient Instructions (Signed)
The current medical regimen is effective;  continue present plan and medications.  Follow up in 1 year with Dr Taylor.  You will receive a letter in the mail 2 months before you are due.  Please call us when you receive this letter to schedule your follow up appointment.  

## 2012-08-31 ENCOUNTER — Emergency Department (HOSPITAL_COMMUNITY)
Admission: EM | Admit: 2012-08-31 | Discharge: 2012-08-31 | Disposition: A | Discharge status: Home / Self Care | Payer: Medicare Other | Attending: Emergency Medicine | Admitting: Emergency Medicine

## 2012-08-31 ENCOUNTER — Encounter (HOSPITAL_COMMUNITY): Payer: Self-pay | Admitting: Emergency Medicine

## 2012-08-31 DIAGNOSIS — E119 Type 2 diabetes mellitus without complications: Secondary | ICD-10-CM | POA: Insufficient documentation

## 2012-08-31 DIAGNOSIS — E785 Hyperlipidemia, unspecified: Secondary | ICD-10-CM | POA: Insufficient documentation

## 2012-08-31 DIAGNOSIS — E039 Hypothyroidism, unspecified: Secondary | ICD-10-CM | POA: Insufficient documentation

## 2012-08-31 DIAGNOSIS — Z794 Long term (current) use of insulin: Secondary | ICD-10-CM | POA: Insufficient documentation

## 2012-08-31 DIAGNOSIS — J3489 Other specified disorders of nose and nasal sinuses: Secondary | ICD-10-CM | POA: Insufficient documentation

## 2012-08-31 DIAGNOSIS — K219 Gastro-esophageal reflux disease without esophagitis: Secondary | ICD-10-CM | POA: Insufficient documentation

## 2012-08-31 DIAGNOSIS — Z950 Presence of cardiac pacemaker: Secondary | ICD-10-CM | POA: Insufficient documentation

## 2012-08-31 DIAGNOSIS — R300 Dysuria: Secondary | ICD-10-CM | POA: Insufficient documentation

## 2012-08-31 DIAGNOSIS — I4891 Unspecified atrial fibrillation: Secondary | ICD-10-CM | POA: Insufficient documentation

## 2012-08-31 DIAGNOSIS — B349 Viral infection, unspecified: Secondary | ICD-10-CM

## 2012-08-31 DIAGNOSIS — I10 Essential (primary) hypertension: Secondary | ICD-10-CM | POA: Insufficient documentation

## 2012-08-31 DIAGNOSIS — Z79899 Other long term (current) drug therapy: Secondary | ICD-10-CM | POA: Insufficient documentation

## 2012-08-31 DIAGNOSIS — F0390 Unspecified dementia without behavioral disturbance: Secondary | ICD-10-CM | POA: Insufficient documentation

## 2012-08-31 DIAGNOSIS — Z87891 Personal history of nicotine dependence: Secondary | ICD-10-CM | POA: Insufficient documentation

## 2012-08-31 LAB — URINALYSIS, ROUTINE W REFLEX MICROSCOPIC
Leukocytes, UA: NEGATIVE
Nitrite: NEGATIVE
Specific Gravity, Urine: 1.02 (ref 1.005–1.030)
Urobilinogen, UA: 0.2 mg/dL (ref 0.0–1.0)
pH: 6 (ref 5.0–8.0)

## 2012-08-31 NOTE — ED Notes (Signed)
Patient with c/o burning on urination and congestion for approx. 1 weeks. Patient at baseline mental status. Caregiver [facility] with patient at bedside.

## 2012-08-31 NOTE — ED Provider Notes (Addendum)
History   This chart was scribed for Donnetta Hutching, MD by Toya Smothers. The patient was seen in room APA06/APA06. Patient's care was started at 0926.  CSN: [id number]  Arrival date & time 08/31/12  4098   First MD Initiated Contact with Patient 08/31/12 [phone]      Chief Complaint  Patient presents with  . Nasal Congestion  . Dysuria   HPI Comments: Pt presents as a level 5 caveat. HPI and ROS are unobtainable due to the state of the patient's dementia.   Patient is a 78 y.o. female presenting with dysuria. The history is provided by the patient and a caregiver. History Limited By: Dementia (Level 5 Caveat) No language interpreter was used.  Dysuria    [name] is a 78 y.o. female who as a level 5 caveat for dementia presents to the Emergency Department complaining of 12 hours of moderate dysuria, and subsiding congestion and nonproductive.HPI and ROS are unobtainable due to the state of the patient's dementia.   Past Medical History  Diagnosis Date  . Diabetes mellitus     insulin dependant  . Hypertension   . Hyperlipidemia   . Paroxysmal atrial fibrillation   . GERD (gastroesophageal reflux disease)   . Dementia   . Cardiac pacemaker 2003, revised 2012    implant  . Hypothyroidism   . Renal insufficiency   . Asthma   . Gastroparesis 06/2011    abnormal GES, 80% retention at two hours    Past Surgical History  Procedure Date  . Pacemaker insertion   . Appendectomy   . Hernia repair 2007    incarcerated  . Hip fracture surgery 2003    left  . Esophagogastroduodenoscopy 07/09/2011    Procedure: ESOPHAGOGASTRODUODENOSCOPY (EGD);  Surgeon: Arlyce Harman, MD;  Location: [facility];  Service: Endoscopy;  Laterality: N/A;.  Mild gastritis, schatzki ring, small hh    Family History  Problem Relation Age of Onset  . Diabetes Father   . Colon cancer Neg Hx     History  Substance Use Topics  . Smoking status: Former Games developer  . Smokeless tobacco: Never Used   Comment: Quit 20 plus years  . Alcohol Use: No   Review of Systems  Unable to perform ROS: Dementia    Allergies  Review of patient's allergies indicates no known allergies.  Home Medications   Current Outpatient Rx  Name Route Sig Dispense Refill  . ACETAMINOPHEN 500 MG PO TABS Oral Take 500-1,000 mg by mouth every 4 (four) hours as needed. For pain/fever     . ALLOPURINOL 100 MG PO TABS Oral Take 100 mg by mouth daily.     Marland Kitchen AMLODIPINE BESYLATE 10 MG PO TABS Oral Take 10 mg by mouth daily.     . ASPIRIN EC 81 MG PO TBEC Oral Take 81 mg by mouth daily.      . ATORVASTATIN CALCIUM 20 MG PO TABS Oral Take 20 mg by mouth at bedtime.     . B COMPLEX-C PO TABS Oral Take 1 tablet by mouth daily.      Marland Kitchen BENAZEPRIL HCL 10 MG PO TABS Oral Take 10 mg by mouth daily.      . DONEPEZIL HCL 5 MG PO TABS Oral Take 5 mg by mouth at bedtime.     . ERGOCALCIFEROL 50000 UNITS PO CAPS Oral Take 50,000 Units by mouth once a week. Take one capsule on Thursdays    . INSULIN ISOPHANE & REGULAR (70-30)  100 UNIT/ML [place] SUSP Subcutaneous Inject 10 Units into the skin 2 (two) times daily. Inject 10 units twice daily at 0800 and 1700     . LEVOTHYROXINE SODIUM 50 MCG PO TABS Oral Take 50 mcg by mouth daily.     Marland Kitchen LORAZEPAM 0.5 MG PO TABS Oral Take 0.25 mg by mouth 3 (three) times daily.     Marland Kitchen METOPROLOL TARTRATE 50 MG PO TABS Oral Take 50 mg by mouth 2 (two) times daily.     Marland Kitchen OMEPRAZOLE 20 MG PO CPDR Oral Take 20 mg by mouth daily before breakfast.      . ONDANSETRON HCL 8 MG PO TABS Oral Take 8 mg by mouth every 6 (six) hours as needed. For nausea    . POLYETHYLENE GLYCOL 3350 GRAN Oral Take 17 g by mouth daily. 527 g 5  . VITAMIN B-12 1000 MCG PO TABS Oral Take 2,000 mcg by mouth daily.        BP 133/56  Pulse 67  Temp 97.7 F (36.5 C) (Oral)  Resp 17  Ht 5\' 5"  (1.651 m)  Wt 144 lb (65.318 kg)  BMI 23.96 kg/m2  SpO2 99%  Physical Exam  Nursing note and vitals reviewed. Constitutional: She is  oriented to person, place, and time. She appears well-developed and well-nourished.  HENT:  Head: Normocephalic and atraumatic.  Eyes: Conjunctivae normal and EOM are normal. Pupils are equal, round, and reactive to light.  Neck: Normal range of motion. Neck supple.  Cardiovascular: Normal rate, regular rhythm and normal heart sounds.   Pulmonary/Chest: Effort normal and breath sounds normal.  Abdominal: Soft. Bowel sounds are normal.  Musculoskeletal: Normal range of motion.  Neurological: She is alert and oriented to person, place, and time.  Skin: Skin is warm and dry.  Psychiatric: She has a normal mood and affect.    ED Course  Procedures (including critical care time) DIAGNOSTIC STUDIES: Oxygen Saturation is 99% on room air, normal by my interpretation.    COORDINATION OF CARE: 10:27- Evaluated Pt. Pt is awake. Nasal congestion and cough have subsided. 10:31- Ordered Urinalysis, Routine w reflex microscopic Once. 10:30- Ordered In and Out Cath Once.  Results for orders placed during the hospital encounter of 08/31/12  URINALYSIS, ROUTINE W REFLEX MICROSCOPIC      Component Value Range   Color, Urine YELLOW  YELLOW   APPearance CLEAR  CLEAR   Specific Gravity, Urine 1.020  1.005 - 1.030   pH 6.0  5.0 - 8.0   Glucose, UA NEGATIVE  NEGATIVE mg/dL   Hgb urine dipstick NEGATIVE  NEGATIVE   Bilirubin Urine NEGATIVE  NEGATIVE   Ketones, ur NEGATIVE  NEGATIVE mg/dL   Protein, ur NEGATIVE  NEGATIVE mg/dL   Urobilinogen, UA 0.2  0.0 - 1.0 mg/dL   Nitrite NEGATIVE  NEGATIVE   Leukocytes, UA NEGATIVE  NEGATIVE      MDM  Patient is in no acute distress. No clinical evidence of pneumonia. Urinalysis normal.     I personally performed the services described in this documentation, which was scribed in my presence. The recorded information has been reviewed and considered.      Donnetta Hutching, MD 08/31/12 1113  Donnetta Hutching, MD 08/31/12 1114

## 2012-08-31 NOTE — ED Notes (Signed)
Pt c/o head congestion and painful urination x 1 week.

## 2012-08-31 NOTE — ED Notes (Signed)
Patient with no complaints at this time. Respirations even and unlabored. Skin warm/dry. Discharge instructions reviewed with patient at this time. Patient given opportunity to voice concerns/ask questions. Patient discharged at this time and left Emergency Department via wheelchair. 

## 2012-11-20 ENCOUNTER — Emergency Department (HOSPITAL_COMMUNITY)
Admission: EM | Admit: 2012-11-20 | Discharge: 2012-11-20 | Disposition: A | Discharge status: Home / Self Care | Payer: Medicare Other | Attending: Emergency Medicine | Admitting: Emergency Medicine

## 2012-11-20 ENCOUNTER — Encounter (HOSPITAL_COMMUNITY): Payer: Self-pay

## 2012-11-20 DIAGNOSIS — E1169 Type 2 diabetes mellitus with other specified complication: Secondary | ICD-10-CM | POA: Insufficient documentation

## 2012-11-20 DIAGNOSIS — Z950 Presence of cardiac pacemaker: Secondary | ICD-10-CM | POA: Insufficient documentation

## 2012-11-20 DIAGNOSIS — R630 Anorexia: Secondary | ICD-10-CM | POA: Insufficient documentation

## 2012-11-20 DIAGNOSIS — Z794 Long term (current) use of insulin: Secondary | ICD-10-CM | POA: Insufficient documentation

## 2012-11-20 DIAGNOSIS — I10 Essential (primary) hypertension: Secondary | ICD-10-CM | POA: Insufficient documentation

## 2012-11-20 DIAGNOSIS — Z87891 Personal history of nicotine dependence: Secondary | ICD-10-CM | POA: Insufficient documentation

## 2012-11-20 DIAGNOSIS — E860 Dehydration: Secondary | ICD-10-CM | POA: Insufficient documentation

## 2012-11-20 DIAGNOSIS — I4891 Unspecified atrial fibrillation: Secondary | ICD-10-CM | POA: Insufficient documentation

## 2012-11-20 DIAGNOSIS — K3184 Gastroparesis: Secondary | ICD-10-CM | POA: Insufficient documentation

## 2012-11-20 DIAGNOSIS — K219 Gastro-esophageal reflux disease without esophagitis: Secondary | ICD-10-CM | POA: Insufficient documentation

## 2012-11-20 DIAGNOSIS — F0390 Unspecified dementia without behavioral disturbance: Secondary | ICD-10-CM | POA: Insufficient documentation

## 2012-11-20 DIAGNOSIS — R531 Weakness: Secondary | ICD-10-CM

## 2012-11-20 DIAGNOSIS — R739 Hyperglycemia, unspecified: Secondary | ICD-10-CM

## 2012-11-20 DIAGNOSIS — R262 Difficulty in walking, not elsewhere classified: Secondary | ICD-10-CM | POA: Insufficient documentation

## 2012-11-20 DIAGNOSIS — Z7982 Long term (current) use of aspirin: Secondary | ICD-10-CM | POA: Insufficient documentation

## 2012-11-20 DIAGNOSIS — E785 Hyperlipidemia, unspecified: Secondary | ICD-10-CM | POA: Insufficient documentation

## 2012-11-20 DIAGNOSIS — Z87448 Personal history of other diseases of urinary system: Secondary | ICD-10-CM | POA: Insufficient documentation

## 2012-11-20 DIAGNOSIS — Z79899 Other long term (current) drug therapy: Secondary | ICD-10-CM | POA: Insufficient documentation

## 2012-11-20 DIAGNOSIS — R5381 Other malaise: Secondary | ICD-10-CM | POA: Insufficient documentation

## 2012-11-20 DIAGNOSIS — J45909 Unspecified asthma, uncomplicated: Secondary | ICD-10-CM | POA: Insufficient documentation

## 2012-11-20 DIAGNOSIS — E039 Hypothyroidism, unspecified: Secondary | ICD-10-CM | POA: Insufficient documentation

## 2012-11-20 LAB — CBC WITH DIFFERENTIAL/PLATELET
Basophils Relative: 1 % (ref 0–1)
Eosinophils Absolute: 0.2 10*3/uL (ref 0.0–0.7)
Hemoglobin: 11.5 g/dL — ABNORMAL LOW (ref 12.0–15.0)
Lymphs Abs: 1.5 10*3/uL (ref 0.7–4.0)
Monocytes Relative: 10 % (ref 3–12)
Neutro Abs: 2.2 10*3/uL (ref 1.7–7.7)
Neutrophils Relative %: 50 % (ref 43–77)
Platelets: 235 10*3/uL (ref 150–400)
RBC: 3.8 MIL/uL — ABNORMAL LOW (ref 3.87–5.11)

## 2012-11-20 LAB — URINALYSIS, ROUTINE W REFLEX MICROSCOPIC
Ketones, ur: NEGATIVE mg/dL
Leukocytes, UA: NEGATIVE
Nitrite: NEGATIVE
Specific Gravity, Urine: 1.02 (ref 1.005–1.030)
pH: 6 (ref 5.0–8.0)

## 2012-11-20 LAB — BASIC METABOLIC PANEL
Chloride: 96 mEq/L (ref 96–112)
Creatinine, Ser: 1.26 mg/dL — ABNORMAL HIGH (ref 0.50–1.10)
GFR calc Af Amer: 44 mL/min — ABNORMAL LOW (ref >90)
Potassium: 4 mEq/L (ref 3.5–5.1)

## 2012-11-20 NOTE — ED Notes (Signed)
Pt arrived by pov, from [facility], staff state that pt has not eaten today and is weak.

## 2012-11-20 NOTE — ED Provider Notes (Signed)
History  This chart was scribed for Flint Melter, MD by Ardeen Jourdain, ED Scribe. This patient was seen in room APA18/APA18 and the patient's care was started at 1406.  CSN: [id number]  Arrival date & time 11/20/12  1329   First MD Initiated Contact with Patient 11/20/12 1406     Level 5 Caveat: Dementia    Chief Complaint  Patient presents with  . Weakness     The history is provided by the patient. The history is limited by the condition of the patient. No language interpreter was used.    [name] is a 78 y.o. female who presents to the Emergency Department complaining of generalized weakness with associated loss of appetite and inability to walk. A caretaker states she was instructed to bring the pt to the ED. She is a pt of [facility]. She has a h/o DM, HTN, dementia.   Past Medical History  Diagnosis Date  . Diabetes mellitus     insulin dependant  . Hypertension   . Hyperlipidemia   . Paroxysmal atrial fibrillation   . GERD (gastroesophageal reflux disease)   . Dementia   . Cardiac pacemaker 2003, revised 2012    implant  . Hypothyroidism   . Renal insufficiency   . Asthma   . Gastroparesis 06/2011    abnormal GES, 80% retention at two hours    Past Surgical History  Procedure Date  . Pacemaker insertion   . Appendectomy   . Hernia repair 2007    incarcerated  . Hip fracture surgery 2003    left  . Esophagogastroduodenoscopy 07/09/2011    Procedure: ESOPHAGOGASTRODUODENOSCOPY (EGD);  Surgeon: Arlyce Harman, MD;  Location: [facility];  Service: Endoscopy;  Laterality: N/A;.  Mild gastritis, schatzki ring, small hh    Family History  Problem Relation Age of Onset  . Diabetes Father   . Colon cancer Neg Hx     History  Substance Use Topics  . Smoking status: Former Games developer  . Smokeless tobacco: Never Used     Comment: Quit 20 plus years  . Alcohol Use: No   No OB history available.   Review of Systems  Unable to perform  ROS: Dementia    Allergies  Review of patient's allergies indicates no known allergies.  Home Medications   Current Outpatient Rx  Name  Route  Sig  Dispense  Refill  . ALLOPURINOL 100 MG PO TABS   Oral   Take 100 mg by mouth daily.          Marland Kitchen AMLODIPINE BESYLATE 10 MG PO TABS   Oral   Take 10 mg by mouth daily.          . ASPIRIN EC 81 MG PO TBEC   Oral   Take 81 mg by mouth daily.           . ATORVASTATIN CALCIUM 20 MG PO TABS   Oral   Take 20 mg by mouth at bedtime.          . B COMPLEX-C PO TABS   Oral   Take 1 tablet by mouth daily.           Marland Kitchen BENAZEPRIL HCL 10 MG PO TABS   Oral   Take 10 mg by mouth daily.           . DONEPEZIL HCL 5 MG PO TABS   Oral   Take 5 mg by mouth at bedtime.          Marland Kitchen  ERGOCALCIFEROL 50000 UNITS PO CAPS   Oral   Take 50,000 Units by mouth once a week. Take one capsule on Thursdays         . HALOPERIDOL 1 MG PO TABS   Oral   Take 1 mg by mouth 2 (two) times daily.         . INSULIN ISOPHANE & REGULAR (70-30) 100 UNIT/ML [place] SUSP   Subcutaneous   Inject 10 Units into the skin 2 (two) times daily. Inject 10 units twice daily at 0800 and 1700          . LEVOTHYROXINE SODIUM 50 MCG PO TABS   Oral   Take 50 mcg by mouth daily.          Marland Kitchen LORAZEPAM 0.5 MG PO TABS   Oral   Take 0.25 mg by mouth 3 (three) times daily.          Marland Kitchen OMEPRAZOLE 20 MG PO CPDR   Oral   Take 20 mg by mouth daily before breakfast.           . POLYETHYLENE GLYCOL 3350 GRAN   Oral   Take 17 g by mouth daily. Patient takes on Mon.,Wed.,Fri.         Marland Kitchen VITAMIN B-12 1000 MCG PO TABS   Oral   Take 2,000 mcg by mouth daily.           . ACETAMINOPHEN 500 MG PO TABS   Oral   Take 500-1,000 mg by mouth every 4 (four) hours as needed. For pain/fever          . ONDANSETRON HCL 8 MG PO TABS   Oral   Take 8 mg by mouth every 6 (six) hours as needed. For nausea           Triage Vitals: BP 124/53  Pulse 60  Temp 98.1 F  (36.7 C) (Oral)  Resp 18  SpO2 100%  Physical Exam  Nursing note and vitals reviewed. Constitutional: She appears well-developed and well-nourished. No distress.  HENT:  Head: Normocephalic and atraumatic.  Right Ear: External ear normal.  Left Ear: External ear normal.  Mouth/Throat: No oropharyngeal exudate.  Eyes: EOM are normal. Pupils are equal, round, and reactive to light.  Neck: Normal range of motion. Neck supple. No tracheal deviation present.  Cardiovascular: Normal rate and regular rhythm.   Murmur heard.      1 out of 6 systolic murmer   Pulmonary/Chest: Effort normal and breath sounds normal. No respiratory distress.  Abdominal: Soft. Bowel sounds are normal. She exhibits no distension. There is no tenderness.  Musculoskeletal: Normal range of motion. She exhibits no edema.  Neurological: She is alert. No cranial nerve deficit. She exhibits normal muscle tone. Coordination normal.  Skin: Skin is warm and dry.  Psychiatric: Her behavior is normal.    ED Course  Procedures (including critical care time)  DIAGNOSTIC STUDIES: Oxygen Saturation is 100% on room air, normal by my interpretation.    COORDINATION OF CARE:  2:09 PM: Discussed treatment plan which includes blood work with pt at bedside and pt agreed to plan.    Labs Reviewed  CBC WITH DIFFERENTIAL - Abnormal; Notable for the following:    RBC 3.80 (*)     Hemoglobin 11.5 (*)     HCT 33.5 (*)     All other components within normal limits  BASIC METABOLIC PANEL - Abnormal; Notable for the following:    Sodium 132 (*)  Glucose, Bld 227 (*)     Creatinine, Ser 1.26 (*)     GFR calc non Af Amer 38 (*)     GFR calc Af Amer 44 (*)     All other components within normal limits  URINALYSIS, ROUTINE W REFLEX MICROSCOPIC   No results found.   1. Weakness   2. Dehydration   3. Hyperglycemia       MDM  Nonspecific weakness, elderly in eatient. She has improved during the treatment observation  period. Labs indicate mild dehydration. She was tolerating oral intake, at discharge. Doubt metabolic instability, serious bacterial infection or impending vascular collapse; the patient is stable for discharge.   Plan: Home Medications- usual; Home Treatments- rest, fluids; Recommended follow up- PCP prn    I personally performed the services described in this documentation, which was scribed in my presence. The recorded information has been reviewed and is accurate.       Flint Melter, MD 11/21/12 [phone]

## 2013-01-13 ENCOUNTER — Encounter (HOSPITAL_COMMUNITY): Payer: Self-pay | Admitting: Emergency Medicine

## 2013-01-13 ENCOUNTER — Emergency Department (HOSPITAL_COMMUNITY)
Admission: EM | Admit: 2013-01-13 | Discharge: 2013-01-13 | Disposition: A | Discharge status: Home / Self Care | Payer: Medicare Other | Attending: Emergency Medicine | Admitting: Emergency Medicine

## 2013-01-13 DIAGNOSIS — K219 Gastro-esophageal reflux disease without esophagitis: Secondary | ICD-10-CM | POA: Insufficient documentation

## 2013-01-13 DIAGNOSIS — I10 Essential (primary) hypertension: Secondary | ICD-10-CM | POA: Insufficient documentation

## 2013-01-13 DIAGNOSIS — F0390 Unspecified dementia without behavioral disturbance: Secondary | ICD-10-CM | POA: Insufficient documentation

## 2013-01-13 DIAGNOSIS — Z87448 Personal history of other diseases of urinary system: Secondary | ICD-10-CM | POA: Insufficient documentation

## 2013-01-13 DIAGNOSIS — E039 Hypothyroidism, unspecified: Secondary | ICD-10-CM | POA: Insufficient documentation

## 2013-01-13 DIAGNOSIS — Z7982 Long term (current) use of aspirin: Secondary | ICD-10-CM | POA: Insufficient documentation

## 2013-01-13 DIAGNOSIS — Z950 Presence of cardiac pacemaker: Secondary | ICD-10-CM | POA: Insufficient documentation

## 2013-01-13 DIAGNOSIS — E785 Hyperlipidemia, unspecified: Secondary | ICD-10-CM | POA: Insufficient documentation

## 2013-01-13 DIAGNOSIS — Z79899 Other long term (current) drug therapy: Secondary | ICD-10-CM | POA: Insufficient documentation

## 2013-01-13 DIAGNOSIS — Z794 Long term (current) use of insulin: Secondary | ICD-10-CM | POA: Insufficient documentation

## 2013-01-13 DIAGNOSIS — Z8719 Personal history of other diseases of the digestive system: Secondary | ICD-10-CM | POA: Insufficient documentation

## 2013-01-13 DIAGNOSIS — J45909 Unspecified asthma, uncomplicated: Secondary | ICD-10-CM | POA: Insufficient documentation

## 2013-01-13 DIAGNOSIS — E119 Type 2 diabetes mellitus without complications: Secondary | ICD-10-CM | POA: Insufficient documentation

## 2013-01-13 DIAGNOSIS — Z8679 Personal history of other diseases of the circulatory system: Secondary | ICD-10-CM | POA: Insufficient documentation

## 2013-01-13 LAB — URINALYSIS, ROUTINE W REFLEX MICROSCOPIC
Leukocytes, UA: NEGATIVE
Nitrite: NEGATIVE
Specific Gravity, Urine: 1.015 (ref 1.005–1.030)
Urobilinogen, UA: 0.2 mg/dL (ref 0.0–1.0)
pH: 6.5 (ref 5.0–8.0)

## 2013-01-13 LAB — CBC WITH DIFFERENTIAL/PLATELET
Basophils Absolute: 0 10*3/uL (ref 0.0–0.1)
Basophils Relative: 0 % (ref 0–1)
Hemoglobin: 11.5 g/dL — ABNORMAL LOW (ref 12.0–15.0)
MCHC: 33 g/dL (ref 30.0–36.0)
Monocytes Relative: 8 % (ref 3–12)
Neutro Abs: 2.7 10*3/uL (ref 1.7–7.7)
Neutrophils Relative %: 59 % (ref 43–77)
Platelets: 286 10*3/uL (ref 150–400)

## 2013-01-13 LAB — BASIC METABOLIC PANEL
BUN: 21 mg/dL (ref 6–23)
Chloride: 95 mEq/L — ABNORMAL LOW (ref 96–112)
GFR calc Af Amer: 47 mL/min — ABNORMAL LOW (ref >90)
Potassium: 4.4 mEq/L (ref 3.5–5.1)
Sodium: 133 mEq/L — ABNORMAL LOW (ref 135–145)

## 2013-01-13 NOTE — ED Notes (Signed)
Patient voided on bedpan to give urine sample.

## 2013-01-13 NOTE — ED Notes (Signed)
Put patient on the bed pan. She urinated at his time

## 2013-01-13 NOTE — ED Notes (Signed)
UTI symptoms for over one month. Pt states hurting in lower back.

## 2013-01-13 NOTE — ED Provider Notes (Signed)
History    This chart was scribed for Osvaldo Human, MD, MD by Smitty Pluck, ED Scribe. The patient was seen in room APA08/APA08 and the patient's care was started at 9:10 AM.   CSN: [id number]  Arrival date & time 01/13/13  0855      No chief complaint on file.  Pt is Level 5 caveat for dementia  Patient is a 79 y.o. female presenting with urinary tract infection. The history is provided by a caregiver and medical records. No language interpreter was used.  Urinary Tract Infection   [name] is a 79 y.o. female with hx of HTN, DM, dementia and cardiac pacemaker who presents to the Emergency Department due to UTI onset 2-3 weeks ago. Per caregiver pt has had generalized weakness. Family states that pt has decreased appetite lately. Caregiver denies fever, sore throat, cough, vomiting, diarrhea, rash, syncope and any other symptoms. Caregiver reports that pt is ambulatory at home.   Past Medical History  Diagnosis Date  . Diabetes mellitus     insulin dependant  . Hypertension   . Hyperlipidemia   . Paroxysmal atrial fibrillation   . GERD (gastroesophageal reflux disease)   . Dementia   . Cardiac pacemaker 2003, revised 2012    implant  . Hypothyroidism   . Renal insufficiency   . Asthma   . Gastroparesis 06/2011    abnormal GES, 80% retention at two hours    Past Surgical History  Procedure Date  . Pacemaker insertion   . Appendectomy   . Hernia repair 2007    incarcerated  . Hip fracture surgery 2003    left  . Esophagogastroduodenoscopy 07/09/2011    Procedure: ESOPHAGOGASTRODUODENOSCOPY (EGD);  Surgeon: Arlyce Harman, MD;  Location: [facility];  Service: Endoscopy;  Laterality: N/A;.  Mild gastritis, schatzki ring, small hh    Family History  Problem Relation Age of Onset  . Diabetes Father   . Colon cancer Neg Hx     History  Substance Use Topics  . Smoking status: Former Games developer  . Smokeless tobacco: Never Used     Comment: Quit 20 plus  years  . Alcohol Use: No    OB History    Grav Para Term Preterm Abortions TAB SAB Ect Mult Living                  Review of Systems  Unable to perform ROS   Allergies  Review of patient's allergies indicates no known allergies.  Home Medications   Current Outpatient Rx  Name  Route  Sig  Dispense  Refill  . ACETAMINOPHEN 500 MG PO TABS   Oral   Take 500-1,000 mg by mouth every 4 (four) hours as needed. For pain/fever          . ALLOPURINOL 100 MG PO TABS   Oral   Take 100 mg by mouth daily.          Marland Kitchen AMLODIPINE BESYLATE 10 MG PO TABS   Oral   Take 10 mg by mouth daily.          . ASPIRIN EC 81 MG PO TBEC   Oral   Take 81 mg by mouth daily.           . ATORVASTATIN CALCIUM 20 MG PO TABS   Oral   Take 20 mg by mouth at bedtime.          . B COMPLEX-C PO TABS   Oral  Take 1 tablet by mouth daily.           Marland Kitchen BENAZEPRIL HCL 10 MG PO TABS   Oral   Take 10 mg by mouth daily.           . DONEPEZIL HCL 5 MG PO TABS   Oral   Take 5 mg by mouth at bedtime.          . ERGOCALCIFEROL 50000 UNITS PO CAPS   Oral   Take 50,000 Units by mouth once a week. Take one capsule on Thursdays         . HALOPERIDOL 1 MG PO TABS   Oral   Take 1 mg by mouth 2 (two) times daily.         . INSULIN ISOPHANE & REGULAR (70-30) 100 UNIT/ML [place] SUSP   Subcutaneous   Inject 10 Units into the skin 2 (two) times daily. Inject 10 units twice daily at 0800 and 1700          . LEVOTHYROXINE SODIUM 50 MCG PO TABS   Oral   Take 50 mcg by mouth daily.          Marland Kitchen LORAZEPAM 0.5 MG PO TABS   Oral   Take 0.25 mg by mouth 3 (three) times daily.          Marland Kitchen OMEPRAZOLE 20 MG PO CPDR   Oral   Take 20 mg by mouth daily before breakfast.           . ONDANSETRON HCL 8 MG PO TABS   Oral   Take 8 mg by mouth every 6 (six) hours as needed. For nausea         . POLYETHYLENE GLYCOL 3350 GRAN   Oral   Take 17 g by mouth daily. Patient takes on Mon.,Wed.,Fri.          Marland Kitchen VITAMIN B-12 1000 MCG PO TABS   Oral   Take 2,000 mcg by mouth daily.             BP 107/45  Temp 97.7 F (36.5 C) (Oral)  Resp 18  Wt 144 lb (65.318 kg)  SpO2 97%  Physical Exam  Nursing note and vitals reviewed. Constitutional: She is oriented to person, place, and time. She appears well-developed and well-nourished. No distress.  HENT:  Head: Normocephalic and atraumatic.  Eyes: EOM are normal. Pupils are equal, round, and reactive to light.  Neck: Normal range of motion. Neck supple. No tracheal deviation present.  Cardiovascular: Normal rate.   Pulmonary/Chest: Effort normal. No respiratory distress.  Abdominal: Soft. She exhibits no distension.  Musculoskeletal: Normal range of motion. She exhibits no edema.  Neurological: She is alert and oriented to person, place, and time.  Skin: Skin is warm and dry.  Psychiatric: She has a normal mood and affect. Her behavior is normal.    ED Course  Procedures (including critical care time) DIAGNOSTIC STUDIES: Oxygen Saturation is 97% on room air, adequate by my interpretation.    COORDINATION OF CARE: 9:17 AM Discussed ED treatment with pt's caregiver and pt's caregiver agrees.  12:30 PM Discussed pt lab results with pt's caregiver.   Results for orders placed during the hospital encounter of 01/13/13  CBC WITH DIFFERENTIAL      Component Value Range   WBC 4.6  4.0 - 10.5 K/uL   RBC 3.84 (*) 3.87 - 5.11 MIL/uL   Hemoglobin 11.5 (*) 12.0 - 15.0 g/dL   HCT 16.1 (*) 09.6 -  46.0 %   MCV 90.9  78.0 - 100.0 fL   MCH 29.9  26.0 - 34.0 pg   MCHC 33.0  30.0 - 36.0 g/dL   RDW 16.1  09.6 - 04.5 %   Platelets 286  150 - 400 K/uL   Neutrophils Relative 59  43 - 77 %   Neutro Abs 2.7  1.7 - 7.7 K/uL   Lymphocytes Relative 29  12 - 46 %   Lymphs Abs 1.3  0.7 - 4.0 K/uL   Monocytes Relative 8  3 - 12 %   Monocytes Absolute 0.4  0.1 - 1.0 K/uL   Eosinophils Relative 4  0 - 5 %   Eosinophils Absolute 0.2  0.0 - 0.7  K/uL   Basophils Relative 0  0 - 1 %   Basophils Absolute 0.0  0.0 - 0.1 K/uL  BASIC METABOLIC PANEL      Component Value Range   Sodium 133 (*) 135 - 145 mEq/L   Potassium 4.4  3.5 - 5.1 mEq/L   Chloride 95 (*) 96 - 112 mEq/L   CO2 30  19 - 32 mEq/L   Glucose, Bld 256 (*) 70 - 99 mg/dL   BUN 21  6 - 23 mg/dL   Creatinine, Ser 4.09 (*) 0.50 - 1.10 mg/dL   Calcium 9.2  8.4 - 81.1 mg/dL   GFR calc non Af Amer 40 (*) >90 mL/min   GFR calc Af Amer 47 (*) >90 mL/min  URINALYSIS, ROUTINE W REFLEX MICROSCOPIC      Component Value Range   Color, Urine YELLOW  YELLOW   APPearance CLEAR  CLEAR   Specific Gravity, Urine 1.015  1.005 - 1.030   pH 6.5  5.0 - 8.0   Glucose, UA NEGATIVE  NEGATIVE mg/dL   Hgb urine dipstick NEGATIVE  NEGATIVE   Bilirubin Urine NEGATIVE  NEGATIVE   Ketones, ur NEGATIVE  NEGATIVE mg/dL   Protein, ur NEGATIVE  NEGATIVE mg/dL   Urobilinogen, UA 0.2  0.0 - 1.0 mg/dL   Nitrite NEGATIVE  NEGATIVE   Leukocytes, UA NEGATIVE  NEGATIVE   Lab tests show hyperglycemia of 256, but no DKA.  UA was negative.  Advised that she should continue to take insulin for her diabetes.  Safe to return to her facility and resume her regular medicines.     1. Diabetes mellitus     I personally performed the services described in this documentation, which was scribed in my presence. The recorded information has been reviewed and is accurate. Osvaldo Human, MD       Carleene Cooper III, MD 01/13/13 [phone]

## 2013-01-14 LAB — URINE CULTURE

## 2013-02-10 ENCOUNTER — Encounter: Payer: Self-pay | Admitting: *Deleted

## 2013-03-16 ENCOUNTER — Encounter: Payer: Self-pay | Admitting: Internal Medicine

## 2013-03-16 ENCOUNTER — Other Ambulatory Visit: Payer: Self-pay | Admitting: Internal Medicine

## 2013-03-16 ENCOUNTER — Ambulatory Visit (INDEPENDENT_AMBULATORY_CARE_PROVIDER_SITE_OTHER): Payer: Medicare Other | Admitting: *Deleted

## 2013-03-16 DIAGNOSIS — I498 Other specified cardiac arrhythmias: Secondary | ICD-10-CM

## 2013-03-16 DIAGNOSIS — R001 Bradycardia, unspecified: Secondary | ICD-10-CM

## 2013-03-16 DIAGNOSIS — I4891 Unspecified atrial fibrillation: Secondary | ICD-10-CM

## 2013-03-16 LAB — PACEMAKER DEVICE OBSERVATION
AL IMPEDENCE PM: 621 Ohm
ATRIAL PACING PM: 72
BATTERY VOLTAGE: 2.78 V
BRDY-0004RV: 130 {beats}/min

## 2013-03-16 NOTE — Progress Notes (Signed)
PPM check 

## 2013-08-23 ENCOUNTER — Emergency Department (HOSPITAL_COMMUNITY): Payer: Medicare Other

## 2013-08-23 ENCOUNTER — Emergency Department (HOSPITAL_COMMUNITY)
Admission: EM | Admit: 2013-08-23 | Discharge: 2013-08-23 | Disposition: A | Discharge status: Home / Self Care | Payer: Medicare Other | Attending: Emergency Medicine | Admitting: Emergency Medicine

## 2013-08-23 ENCOUNTER — Encounter (HOSPITAL_COMMUNITY): Payer: Self-pay | Admitting: Emergency Medicine

## 2013-08-23 DIAGNOSIS — Z87891 Personal history of nicotine dependence: Secondary | ICD-10-CM | POA: Insufficient documentation

## 2013-08-23 DIAGNOSIS — R1110 Vomiting, unspecified: Secondary | ICD-10-CM | POA: Insufficient documentation

## 2013-08-23 DIAGNOSIS — Z8719 Personal history of other diseases of the digestive system: Secondary | ICD-10-CM | POA: Insufficient documentation

## 2013-08-23 DIAGNOSIS — E119 Type 2 diabetes mellitus without complications: Secondary | ICD-10-CM | POA: Insufficient documentation

## 2013-08-23 DIAGNOSIS — R509 Fever, unspecified: Secondary | ICD-10-CM | POA: Insufficient documentation

## 2013-08-23 DIAGNOSIS — Z950 Presence of cardiac pacemaker: Secondary | ICD-10-CM | POA: Insufficient documentation

## 2013-08-23 DIAGNOSIS — Z79899 Other long term (current) drug therapy: Secondary | ICD-10-CM | POA: Insufficient documentation

## 2013-08-23 DIAGNOSIS — Z794 Long term (current) use of insulin: Secondary | ICD-10-CM | POA: Insufficient documentation

## 2013-08-23 DIAGNOSIS — I4891 Unspecified atrial fibrillation: Secondary | ICD-10-CM | POA: Insufficient documentation

## 2013-08-23 DIAGNOSIS — Z87448 Personal history of other diseases of urinary system: Secondary | ICD-10-CM | POA: Insufficient documentation

## 2013-08-23 DIAGNOSIS — Z7982 Long term (current) use of aspirin: Secondary | ICD-10-CM | POA: Insufficient documentation

## 2013-08-23 DIAGNOSIS — E039 Hypothyroidism, unspecified: Secondary | ICD-10-CM | POA: Insufficient documentation

## 2013-08-23 DIAGNOSIS — E785 Hyperlipidemia, unspecified: Secondary | ICD-10-CM | POA: Insufficient documentation

## 2013-08-23 DIAGNOSIS — K219 Gastro-esophageal reflux disease without esophagitis: Secondary | ICD-10-CM | POA: Insufficient documentation

## 2013-08-23 DIAGNOSIS — F0390 Unspecified dementia without behavioral disturbance: Secondary | ICD-10-CM | POA: Insufficient documentation

## 2013-08-23 DIAGNOSIS — I10 Essential (primary) hypertension: Secondary | ICD-10-CM | POA: Insufficient documentation

## 2013-08-23 LAB — COMPREHENSIVE METABOLIC PANEL
AST: 27 U/L (ref 0–37)
BUN: 21 mg/dL (ref 6–23)
CO2: 28 mEq/L (ref 19–32)
Calcium: 9.1 mg/dL (ref 8.4–10.5)
Chloride: 96 mEq/L (ref 96–112)
Creatinine, Ser: 1.09 mg/dL (ref 0.50–1.10)
GFR calc Af Amer: 52 mL/min — ABNORMAL LOW (ref >90)
GFR calc non Af Amer: 45 mL/min — ABNORMAL LOW (ref >90)
Glucose, Bld: 98 mg/dL (ref 70–99)
Total Bilirubin: 1.5 mg/dL — ABNORMAL HIGH (ref 0.3–1.2)

## 2013-08-23 LAB — CBC WITH DIFFERENTIAL/PLATELET
Basophils Absolute: 0 10*3/uL (ref 0.0–0.1)
Eosinophils Relative: 0 % (ref 0–5)
HCT: 32.5 % — ABNORMAL LOW (ref 36.0–46.0)
Hemoglobin: 11.2 g/dL — ABNORMAL LOW (ref 12.0–15.0)
Lymphocytes Relative: 11 % — ABNORMAL LOW (ref 12–46)
Lymphs Abs: 0.8 10*3/uL (ref 0.7–4.0)
MCV: 87.4 fL (ref 78.0–100.0)
Monocytes Absolute: 0.5 10*3/uL (ref 0.1–1.0)
Monocytes Relative: 6 % (ref 3–12)
Neutro Abs: 6.5 10*3/uL (ref 1.7–7.7)
RDW: 13.3 % (ref 11.5–15.5)
WBC: 7.8 10*3/uL (ref 4.0–10.5)

## 2013-08-23 LAB — URINALYSIS, ROUTINE W REFLEX MICROSCOPIC
Glucose, UA: NEGATIVE mg/dL
Hgb urine dipstick: NEGATIVE
Leukocytes, UA: NEGATIVE
Protein, ur: 30 mg/dL — AB
pH: 5.5 (ref 5.0–8.0)

## 2013-08-23 LAB — URINE MICROSCOPIC-ADD ON

## 2013-08-23 MED ORDER — SODIUM CHLORIDE 0.9 % IV BOLUS (SEPSIS)
500.0000 mL | Freq: Once | INTRAVENOUS | Status: AC
  Administered 2013-08-23: 500 mL via INTRAVENOUS

## 2013-08-23 MED ORDER — IBUPROFEN 100 MG/5ML PO SUSP: 800.0000 mg | Freq: Once | ORAL | Status: DC

## 2013-08-23 MED ORDER — ACETAMINOPHEN 650 MG RE SUPP
650.0000 mg | Freq: Once | RECTAL | Status: AC
  Administered 2013-08-23: 650 mg via RECTAL
  Filled 2013-08-23: qty 1

## 2013-08-23 MED ORDER — ONDANSETRON HCL 4 MG/2ML IJ SOLN
4.0000 mg | Freq: Once | INTRAMUSCULAR | Status: AC
  Administered 2013-08-23: 4 mg via INTRAVENOUS
  Filled 2013-08-23: qty 2

## 2013-08-23 MED ORDER — LEVOFLOXACIN 750 MG PO TABS: 750.0000 mg | ORAL_TABLET | Freq: Every day | ORAL | Status: DC

## 2013-08-23 NOTE — ED Notes (Signed)
[facility] comes [facility] EMS from [facility] after staff called EMS reporting new onset of vomiting and diarrhea. Pt denies any pain at this time. Staff also reports pt had temp of 102 prior to EMS arrival.

## 2013-08-23 NOTE — ED Provider Notes (Signed)
CSN: [id number]     Arrival date & time 08/23/13  1322 History   This chart was scribed for Geoffery Lyons, MD, by Yevette Edwards, ED Scribe. This patient was seen in room APA04/APA04 and the patient's care was started at 2:24 PM. First MD Initiated Contact with Patient 08/23/13 1417     Chief Complaint  Patient presents with  . Emesis  . Diarrhea   Level 5 Caveat (Dementia) (Consider location/radiation/quality/duration/timing/severity/associated sxs/prior Treatment) The history is provided by the patient and a caregiver. The history is limited by the condition of the patient. No language interpreter was used.   HPI Comments: [name] is a 79 y.o. female, with dementia, DM, and HTN,  who presents to the Emergency Department complaining of acute emesis which began. The facility where the pt resides states the pt has had episodes of emesis, diarrhea, and a fever; in the ED her temperature is 102.7 F. When asked at bedside, the pt denies emesis or abdominal pain.   The pt resides in [facility].   Past Medical History  Diagnosis Date  . Diabetes mellitus     insulin dependant  . Hypertension   . Hyperlipidemia   . Paroxysmal atrial fibrillation   . GERD (gastroesophageal reflux disease)   . Dementia   . Cardiac pacemaker 2003, revised 2012    implant  . Hypothyroidism   . Renal insufficiency   . Asthma   . Gastroparesis 06/2011    abnormal GES, 80% retention at two hours   Past Surgical History  Procedure Laterality Date  . Pacemaker insertion    . Appendectomy    . Hernia repair  2007    incarcerated  . Hip fracture surgery  2003    left  . Esophagogastroduodenoscopy  07/09/2011    Procedure: ESOPHAGOGASTRODUODENOSCOPY (EGD);  Surgeon: Arlyce Harman, MD;  [facility]: [facility];  Service: Endoscopy;  Laterality: N/A;.  Mild gastritis, schatzki ring, small hh   Family History  Problem Relation Age of Onset  . Diabetes Father   . Colon cancer Neg Hx     History  Substance Use Topics  . Smoking status: Former Games developer  . Smokeless tobacco: Never Used     Comment: Quit 20 plus years  . Alcohol Use: No   No OB history provided.  Review of Systems  Unable to perform ROS: Dementia    Allergies  Review of patient's allergies indicates no known allergies.  Home Medications   Current Outpatient Rx  Name  Route  Sig  Dispense  Refill  . acetaminophen (TYLENOL) 500 MG tablet   Oral   Take 500-1,000 mg by mouth every 4 (four) hours as needed. For pain/fever          . allopurinol (ZYLOPRIM) 100 MG tablet   Oral   Take 100 mg by mouth daily.          Marland Kitchen amLODipine (NORVASC) 10 MG tablet   Oral   Take 10 mg by mouth daily.          Marland Kitchen aspirin EC 81 MG tablet   Oral   Take 81 mg by mouth daily.           Marland Kitchen atorvastatin (LIPITOR) 20 MG tablet   Oral   Take 20 mg by mouth at bedtime.          . B Complex-C (B-COMPLEX WITH VITAMIN C) tablet   Oral   Take 1 tablet by mouth daily.           Marland Kitchen  donepezil (ARICEPT) 10 MG tablet   Oral   Take 10 mg by mouth at bedtime.         . donepezil (ARICEPT) 5 MG tablet   Oral   Take 10 mg by mouth at bedtime.          . ergocalciferol (VITAMIN D2) 50000 UNITS capsule   Oral   Take 50,000 Units by mouth once a week. Take one capsule on Thursdays         . haloperidol (HALDOL) 1 MG tablet   Oral   Take 1 mg by mouth 2 (two) times daily.         . insulin NPH-insulin regular (NOVOLIN 70/30) (70-30) 100 UNIT/ML injection   Subcutaneous   Inject 10 Units into the skin 2 (two) times daily. Inject 10 units twice daily at 0800 and 1700          . levothyroxine (SYNTHROID, LEVOTHROID) 50 MCG tablet   Oral   Take 50 mcg by mouth daily.          Marland Kitchen LORazepam (ATIVAN) 0.5 MG tablet   Oral   Take 0.25 mg by mouth 3 (three) times daily.          . memantine (NAMENDA) 5 MG tablet   Oral   Take 5 mg by mouth 2 (two) times daily.         . metoprolol tartrate  (LOPRESSOR) 25 MG tablet   Oral   Take 25 mg by mouth 2 (two) times daily.         . naphazoline-pheniramine (NAPHCON-A) 0.025-0.3 % ophthalmic solution   Both Eyes   Place 1 drop into both eyes 3 (three) times daily as needed. For itching         . omeprazole (PRILOSEC) 20 MG capsule   Oral   Take 20 mg by mouth daily before breakfast.           . ondansetron (ZOFRAN) 8 MG tablet   Oral   Take 8 mg by mouth every 6 (six) hours as needed. For nausea         . Polyethylene Glycol 3350 GRAN   Oral   Take 17 g by mouth daily. Patient takes on Mon.,Wed.,Fri.         . vitamin B-12 (CYANOCOBALAMIN) 1000 MCG tablet   Oral   Take 2,000 mcg by mouth daily.            Triage Vitals: BP 103/58  Pulse 61  Temp(Src) 102.7 F (39.3 C) (Oral)  Resp 18  SpO2 100% Physical Exam  Nursing note and vitals reviewed. Constitutional: She is oriented to person, place, and time. She appears well-developed and well-nourished. No distress.  PT is a elderly female who is chronically debilitated with a history of dementia. She is resting comfortably and in no distress.   HENT:  Head: Normocephalic and atraumatic.  Eyes: EOM are normal.  Neck: Neck supple. No tracheal deviation present.  Cardiovascular: Normal rate, regular rhythm and normal heart sounds.   No murmur heard. Pulmonary/Chest: Effort normal and breath sounds normal. No respiratory distress.  Abdominal: Soft. Bowel sounds are normal. There is no tenderness.  Musculoskeletal: Normal range of motion. She exhibits no edema.  Neurological: She is alert and oriented to person, place, and time.  Skin: Skin is warm and dry.  Psychiatric: She has a normal mood and affect. Her behavior is normal.    ED Course  Procedures (including critical care time)  DIAGNOSTIC STUDIES: Oxygen Saturation is 100% on room air, normal by my interpretation.    COORDINATION OF CARE:  2:27 PM- Discussed treatment plan with patient, and the  patient agreed to the plan.   Labs Review Labs Reviewed  CBC WITH DIFFERENTIAL - Abnormal; Notable for the following:    RBC 3.72 (*)    Hemoglobin 11.2 (*)    HCT 32.5 (*)    Neutrophils Relative % 83 (*)    Lymphocytes Relative 11 (*)    All other components within normal limits  CULTURE, BLOOD (ROUTINE X 2)  CULTURE, BLOOD (ROUTINE X 2)  URINALYSIS, ROUTINE W REFLEX MICROSCOPIC  COMPREHENSIVE METABOLIC PANEL   Imaging Review Dg Chest 2 View  08/23/2013   CLINICAL DATA:  Lethargy and weakness.  EXAM: CHEST  2 VIEW  COMPARISON:  12/08/2011  FINDINGS: Dual lead pacer remains in place. Atherosclerotic aorta. Borderline cardiomegaly. Mildly blunted right lateral costophrenic angle. Subsegmental atelectasis above along both lung bases. Suspected mild left lower lobe airspace opacity  Lateral view suboptimal due to arm positioning and difficulty positioning patient  IMPRESSION: *Suspected left lower lobe airspace opacity may reflect pneumonia or atelectasis. *Small right pleural effusion. *Borderline cardiomegaly. *A atherosclerosis.   Electronically Signed   By: Herbie Baltimore   On: 08/23/2013 15:00    MDM  No diagnosis found. The patient is an 79 year old female with past medical history significant for dementia and pacemaker placement. She is currently a resident of a local nursing home who sent her in for evaluation of fever and vomiting and diarrhea. There is a Chemical engineer present with her who states that she appears to be at her baseline. Workup today reveals no elevation of white count, and unremarkable urinalysis, however chest x-ray does show subtle findings that are suggestive of pneumonia. As she was febrile I feel as though this should be treated. She will be prescribed Levaquin and appears to be stable for return to the nursing home. She has had no further vomiting or diarrhea in the ER.  I personally performed the services described in this documentation, which was scribed in my  presence. The recorded information has been reviewed and is accurate.       Geoffery Lyons, MD 08/23/13 [phone]

## 2013-08-28 LAB — CULTURE, BLOOD (ROUTINE X 2): Culture: NO GROWTH

## 2013-09-15 ENCOUNTER — Encounter (HOSPITAL_COMMUNITY): Payer: Self-pay | Admitting: *Deleted

## 2013-09-15 ENCOUNTER — Emergency Department (HOSPITAL_COMMUNITY)
Admission: EM | Admit: 2013-09-15 | Discharge: 2013-09-15 | Disposition: A | Discharge status: Home / Self Care | Payer: Medicare Other | Attending: Emergency Medicine | Admitting: Emergency Medicine

## 2013-09-15 ENCOUNTER — Emergency Department (HOSPITAL_COMMUNITY): Payer: Medicare Other

## 2013-09-15 DIAGNOSIS — R059 Cough, unspecified: Secondary | ICD-10-CM | POA: Insufficient documentation

## 2013-09-15 DIAGNOSIS — Z950 Presence of cardiac pacemaker: Secondary | ICD-10-CM | POA: Insufficient documentation

## 2013-09-15 DIAGNOSIS — E1169 Type 2 diabetes mellitus with other specified complication: Secondary | ICD-10-CM | POA: Insufficient documentation

## 2013-09-15 DIAGNOSIS — K219 Gastro-esophageal reflux disease without esophagitis: Secondary | ICD-10-CM | POA: Insufficient documentation

## 2013-09-15 DIAGNOSIS — Z794 Long term (current) use of insulin: Secondary | ICD-10-CM | POA: Insufficient documentation

## 2013-09-15 DIAGNOSIS — Z7982 Long term (current) use of aspirin: Secondary | ICD-10-CM | POA: Insufficient documentation

## 2013-09-15 DIAGNOSIS — Z87891 Personal history of nicotine dependence: Secondary | ICD-10-CM | POA: Insufficient documentation

## 2013-09-15 DIAGNOSIS — E785 Hyperlipidemia, unspecified: Secondary | ICD-10-CM | POA: Insufficient documentation

## 2013-09-15 DIAGNOSIS — Z87448 Personal history of other diseases of urinary system: Secondary | ICD-10-CM | POA: Insufficient documentation

## 2013-09-15 DIAGNOSIS — E162 Hypoglycemia, unspecified: Secondary | ICD-10-CM

## 2013-09-15 DIAGNOSIS — J45909 Unspecified asthma, uncomplicated: Secondary | ICD-10-CM | POA: Insufficient documentation

## 2013-09-15 DIAGNOSIS — E039 Hypothyroidism, unspecified: Secondary | ICD-10-CM | POA: Insufficient documentation

## 2013-09-15 DIAGNOSIS — Z79899 Other long term (current) drug therapy: Secondary | ICD-10-CM | POA: Insufficient documentation

## 2013-09-15 DIAGNOSIS — R5383 Other fatigue: Secondary | ICD-10-CM

## 2013-09-15 DIAGNOSIS — F0390 Unspecified dementia without behavioral disturbance: Secondary | ICD-10-CM | POA: Insufficient documentation

## 2013-09-15 DIAGNOSIS — I10 Essential (primary) hypertension: Secondary | ICD-10-CM | POA: Insufficient documentation

## 2013-09-15 DIAGNOSIS — I252 Old myocardial infarction: Secondary | ICD-10-CM | POA: Insufficient documentation

## 2013-09-15 DIAGNOSIS — I2510 Atherosclerotic heart disease of native coronary artery without angina pectoris: Secondary | ICD-10-CM | POA: Insufficient documentation

## 2013-09-15 DIAGNOSIS — R05 Cough: Secondary | ICD-10-CM | POA: Insufficient documentation

## 2013-09-15 HISTORY — DX: Acute myocardial infarction, unspecified: I21.9

## 2013-09-15 HISTORY — DX: Atherosclerotic heart disease of native coronary artery without angina pectoris: I25.10

## 2013-09-15 LAB — BASIC METABOLIC PANEL
BUN: 11 mg/dL (ref 6–23)
CO2: 32 mEq/L (ref 19–32)
Chloride: 97 mEq/L (ref 96–112)
GFR calc non Af Amer: 50 mL/min — ABNORMAL LOW (ref >90)
Glucose, Bld: 59 mg/dL — ABNORMAL LOW (ref 70–99)
Potassium: 3.8 mEq/L (ref 3.5–5.1)
Sodium: 135 mEq/L (ref 135–145)

## 2013-09-15 LAB — CBC WITH DIFFERENTIAL/PLATELET
Eosinophils Absolute: 0.2 10*3/uL (ref 0.0–0.7)
Hemoglobin: 12.9 g/dL (ref 12.0–15.0)
Lymphocytes Relative: 29 % (ref 12–46)
Lymphs Abs: 1.3 10*3/uL (ref 0.7–4.0)
MCH: 29.7 pg (ref 26.0–34.0)
Monocytes Absolute: 0.4 10*3/uL (ref 0.1–1.0)
Monocytes Relative: 9 % (ref 3–12)
Neutrophils Relative %: 59 % (ref 43–77)
RBC: 4.34 MIL/uL (ref 3.87–5.11)
WBC: 4.6 10*3/uL (ref 4.0–10.5)

## 2013-09-15 LAB — URINE MICROSCOPIC-ADD ON

## 2013-09-15 LAB — URINALYSIS, ROUTINE W REFLEX MICROSCOPIC
Glucose, UA: NEGATIVE mg/dL
Hgb urine dipstick: NEGATIVE
Leukocytes, UA: NEGATIVE
Protein, ur: 30 mg/dL — AB
Specific Gravity, Urine: 1.025 (ref 1.005–1.030)
pH: 6.5 (ref 5.0–8.0)

## 2013-09-15 LAB — GLUCOSE, CAPILLARY: Glucose-Capillary: 78 mg/dL (ref 70–99)

## 2013-09-15 MED ORDER — SODIUM CHLORIDE 0.9 % IV BOLUS (SEPSIS): 500.0000 mL | Freq: Once | INTRAVENOUS | Status: DC

## 2013-09-15 NOTE — ED Notes (Signed)
Attempted to get patient to eat and also attempted to get patient up to ambulate - she covered her head with her blanket.  Is alert.

## 2013-09-15 NOTE — ED Notes (Signed)
Transporter from patients care facility sitting in room with her - she states patient has not been up walking for several weeks.  Will attempt to ambulate with two assists

## 2013-09-15 NOTE — ED Notes (Signed)
Drank 3 ounces (4 ounces offered) of orange juice with two teaspoons of sugar.  No difficulty swallowing - noted when she coughs it is very congested sounding yet non-productive.

## 2013-09-15 NOTE — ED Notes (Signed)
Patient drank the remaining ounce of juice.

## 2013-09-15 NOTE — ED Provider Notes (Signed)
CSN: [id number]     Arrival date & time 09/15/13  1558 History   First MD Initiated Contact with Patient 09/15/13 1649     Chief Complaint  Patient presents with  . Weakness    Patient is a 79 y.o. female presenting with weakness. The history is provided by the patient and a caregiver. History limited by: dementia.  Weakness This is a new problem. The current episode started more than 2 days ago. The problem occurs daily. The problem has been gradually worsening. Exacerbated by: activity. The symptoms are relieved by rest. She has tried rest for the symptoms.  pt presents from family care home for generalized weakness as well as cough/congestion Per caregiver, pt has had increasing weakness for several weeks including difficulty walking No LOC/falls reported Also, it is reported she has had increasing cough/congestion for past several days No other acute changes are reported  Patient is unable to give any further history  Past Medical History  Diagnosis Date  . Diabetes mellitus     insulin dependant  . Hypertension   . Hyperlipidemia   . Paroxysmal atrial fibrillation   . GERD (gastroesophageal reflux disease)   . Dementia   . Cardiac pacemaker 2003, revised 2012    implant  . Hypothyroidism   . Renal insufficiency   . Asthma   . Gastroparesis 06/2011    abnormal GES, 80% retention at two hours  . Coronary artery disease   . Dementia   . Myocardial infarction    Past Surgical History  Procedure Laterality Date  . Pacemaker insertion    . Appendectomy    . Hernia repair  2007    incarcerated  . Hip fracture surgery  2003    left  . Esophagogastroduodenoscopy  07/09/2011    Procedure: ESOPHAGOGASTRODUODENOSCOPY (EGD);  Surgeon: Arlyce Harman, MD;  Location: [facility];  Service: Endoscopy;  Laterality: N/A;.  Mild gastritis, schatzki ring, small hh   Family History  Problem Relation Age of Onset  . Diabetes Father   . Colon cancer Neg Hx    History  Substance  Use Topics  . Smoking status: Former Games developer  . Smokeless tobacco: Never Used     Comment: Quit 20 plus years  . Alcohol Use: No   OB History   Grav Para Term Preterm Abortions TAB SAB Ect Mult Living                 Review of Systems  Unable to perform ROS: Dementia  Constitutional: Negative for fever.  Respiratory: Positive for cough.   Gastrointestinal: Negative for vomiting and diarrhea.  Neurological: Positive for weakness.    Allergies  Review of patient's allergies indicates no known allergies.  Home Medications   Current Outpatient Rx  Name  Route  Sig  Dispense  Refill  . acetaminophen (TYLENOL) 500 MG tablet   Oral   Take 500-1,000 mg by mouth every 4 (four) hours as needed. For pain/fever          . allopurinol (ZYLOPRIM) 100 MG tablet   Oral   Take 100 mg by mouth daily.          Marland Kitchen amLODipine (NORVASC) 10 MG tablet   Oral   Take 10 mg by mouth daily.          Marland Kitchen aspirin EC 81 MG tablet   Oral   Take 81 mg by mouth daily.           Marland Kitchen B  Complex-C (B-COMPLEX WITH VITAMIN C) tablet   Oral   Take 1 tablet by mouth daily.           . benazepril (LOTENSIN) 10 MG tablet   Oral   Take 10 mg by mouth daily.         Marland Kitchen donepezil (ARICEPT) 10 MG tablet   Oral   Take 10 mg by mouth at bedtime.         . ergocalciferol (VITAMIN D2) 50000 UNITS capsule   Oral   Take 50,000 Units by mouth every Thursday. Take one capsule on Thursdays         . haloperidol (HALDOL) 1 MG tablet   Oral   Take 1 mg by mouth 2 (two) times daily.         . insulin NPH-insulin regular (NOVOLIN 70/30) (70-30) 100 UNIT/ML injection   Subcutaneous   Inject 10 Units into the skin 2 (two) times daily. Inject 10 units twice daily at 0800 and 1700          . levothyroxine (SYNTHROID, LEVOTHROID) 50 MCG tablet   Oral   Take 50 mcg by mouth daily.          Marland Kitchen LORazepam (ATIVAN) 0.5 MG tablet   Oral   Take 0.25 mg by mouth 3 (three) times daily.          .  memantine (NAMENDA) 5 MG tablet   Oral   Take 5 mg by mouth 2 (two) times daily.         . metoprolol tartrate (LOPRESSOR) 25 MG tablet   Oral   Take 25 mg by mouth 2 (two) times daily.         Marland Kitchen omeprazole (PRILOSEC) 20 MG capsule   Oral   Take 20 mg by mouth daily before breakfast.           . Polyethylene Glycol 3350 GRAN   Oral   Take 17 g by mouth every Monday, Wednesday, and Friday. Patient takes on Mon.,Wed.,Fri.         . pravastatin (PRAVACHOL) 20 MG tablet   Oral   Take 20 mg by mouth every evening.         . vitamin B-12 (CYANOCOBALAMIN) 1000 MCG tablet   Oral   Take 2,000 mcg by mouth daily.           . naphazoline-pheniramine (NAPHCON-A) 0.025-0.3 % ophthalmic solution   Both Eyes   Place 1 drop into both eyes 3 (three) times daily as needed. For itching         . ondansetron (ZOFRAN) 8 MG tablet   Oral   Take 8 mg by mouth every 6 (six) hours as needed. For nausea          BP 102/55  Pulse 62  Temp(Src) 97.8 F (36.6 C) (Oral)  Resp 18  Wt 144 lb (65.318 kg)  BMI 23.96 kg/m2  SpO2 95% BP 136/94  Pulse 70  Temp(Src) 97.8 F (36.6 C) (Oral)  Resp 20  Wt 144 lb (65.318 kg)  BMI 23.96 kg/m2  SpO2 95%  Physical Exam CONSTITUTIONAL: elderly, no distress noted HEAD: Normocephalic/atraumatic EYES: EOMI ENMT: Mucous membranes moist NECK: supple no meningeal signs SPINE:entire spine nontender CV: S1/S2 noted, no murmurs/rubs/gallops noted LUNGS: Lungs are clear to auscultation bilaterally, no apparent distress ABDOMEN: soft, nontender, no rebound or guarding GU:no cva tenderness NEURO: Pt is awake/alert, moves all extremitiesx4. She is nonverbal but will folllow commands.  No  distress noted EXTREMITIES: pulses normal, full ROM SKIN: warm, color normal PSYCH: no abnormalities of mood noted  ED Course  Procedures  Labs Review Labs Reviewed  CBC WITH DIFFERENTIAL  BASIC METABOLIC PANEL  TROPONIN I  URINALYSIS, ROUTINE W REFLEX  MICROSCOPIC   6:06 PM Labs/imaging ordered Will also give fluid bolus Pt currently stable 9:09 PM Pt observed for several hrs in the ED She was found to be mildly hypoglycemic.  She is taking PO and this is improving Review of her records reveals she has had episodes of low glucose in the AM and still given insulin  This could be cause of her weakness Advised to hold her insulin until she sees her PCP in two days Also, pt has not ambulated in weeks.  When I asked her to walk she just put the covers over her head I suspect part of this is behavioral Will d/c back to facility and PCP followup  MDM  No diagnosis found. Nursing notes including past medical history and social history reviewed and considered in documentation xrays reviewed and considered Labs/vital reviewed and considered Previous records reviewed and considered - recent ED visit reviewed    Date: 09/15/2013 1822  Rate: 60  Rhythm: indeterminate  QRS Axis: left  Intervals: normal  ST/T Wave abnormalities: nonspecific ST changes  Conduction Disutrbances:paced rhythm noted  Narrative Interpretation:   Old EKG Reviewed: unchanged    Joya Gaskins, MD 09/15/13 2112

## 2013-09-15 NOTE — ED Notes (Signed)
Pt from [facility].  Care giver reports congestion and weakness, Unable to walk.

## 2013-09-15 NOTE — ED Notes (Signed)
Patient could not sit up on side of bed without support or move to wheelchair without support of two staff members.  DC papers to transporter to take back [name] - states she has her scheduled for MD visit in two days.

## 2013-09-15 NOTE — ED Notes (Signed)
Patient does not to eat food provided for her at this time.

## 2013-09-24 ENCOUNTER — Inpatient Hospital Stay (HOSPITAL_COMMUNITY): Payer: Medicare Other

## 2013-09-24 ENCOUNTER — Encounter (HOSPITAL_COMMUNITY): Payer: Self-pay

## 2013-09-24 ENCOUNTER — Inpatient Hospital Stay (HOSPITAL_COMMUNITY)
Admission: AD | Admit: 2013-09-24 | Discharge: 2013-09-28 | DRG: 689 | Disposition: A | Discharge status: Skilled Nursing Facility | Payer: Medicare Other | Source: Ambulatory Visit | Attending: Family Medicine | Admitting: Family Medicine

## 2013-09-24 DIAGNOSIS — I10 Essential (primary) hypertension: Secondary | ICD-10-CM | POA: Diagnosis present

## 2013-09-24 DIAGNOSIS — N390 Urinary tract infection, site not specified: Principal | ICD-10-CM | POA: Diagnosis present

## 2013-09-24 DIAGNOSIS — Z79899 Other long term (current) drug therapy: Secondary | ICD-10-CM

## 2013-09-24 DIAGNOSIS — R627 Adult failure to thrive: Secondary | ICD-10-CM | POA: Diagnosis present

## 2013-09-24 DIAGNOSIS — IMO0002 Reserved for concepts with insufficient information to code with codable children: Secondary | ICD-10-CM

## 2013-09-24 DIAGNOSIS — I252 Old myocardial infarction: Secondary | ICD-10-CM

## 2013-09-24 DIAGNOSIS — R001 Bradycardia, unspecified: Secondary | ICD-10-CM

## 2013-09-24 DIAGNOSIS — R1013 Epigastric pain: Secondary | ICD-10-CM

## 2013-09-24 DIAGNOSIS — E1143 Type 2 diabetes mellitus with diabetic autonomic (poly)neuropathy: Secondary | ICD-10-CM

## 2013-09-24 DIAGNOSIS — E039 Hypothyroidism, unspecified: Secondary | ICD-10-CM | POA: Diagnosis present

## 2013-09-24 DIAGNOSIS — I2510 Atherosclerotic heart disease of native coronary artery without angina pectoris: Secondary | ICD-10-CM | POA: Diagnosis present

## 2013-09-24 DIAGNOSIS — R110 Nausea: Secondary | ICD-10-CM

## 2013-09-24 DIAGNOSIS — G9340 Encephalopathy, unspecified: Secondary | ICD-10-CM

## 2013-09-24 DIAGNOSIS — K2970 Gastritis, unspecified, without bleeding: Secondary | ICD-10-CM

## 2013-09-24 DIAGNOSIS — E119 Type 2 diabetes mellitus without complications: Secondary | ICD-10-CM

## 2013-09-24 DIAGNOSIS — J45909 Unspecified asthma, uncomplicated: Secondary | ICD-10-CM | POA: Diagnosis present

## 2013-09-24 DIAGNOSIS — I4891 Unspecified atrial fibrillation: Secondary | ICD-10-CM

## 2013-09-24 DIAGNOSIS — E785 Hyperlipidemia, unspecified: Secondary | ICD-10-CM | POA: Diagnosis present

## 2013-09-24 DIAGNOSIS — K219 Gastro-esophageal reflux disease without esophagitis: Secondary | ICD-10-CM | POA: Diagnosis present

## 2013-09-24 DIAGNOSIS — K3184 Gastroparesis: Secondary | ICD-10-CM | POA: Diagnosis present

## 2013-09-24 DIAGNOSIS — A498 Other bacterial infections of unspecified site: Secondary | ICD-10-CM | POA: Diagnosis present

## 2013-09-24 DIAGNOSIS — R634 Abnormal weight loss: Secondary | ICD-10-CM

## 2013-09-24 DIAGNOSIS — G9341 Metabolic encephalopathy: Secondary | ICD-10-CM | POA: Diagnosis present

## 2013-09-24 DIAGNOSIS — E860 Dehydration: Secondary | ICD-10-CM

## 2013-09-24 DIAGNOSIS — E1169 Type 2 diabetes mellitus with other specified complication: Secondary | ICD-10-CM | POA: Diagnosis present

## 2013-09-24 DIAGNOSIS — Z833 Family history of diabetes mellitus: Secondary | ICD-10-CM

## 2013-09-24 DIAGNOSIS — E43 Unspecified severe protein-calorie malnutrition: Secondary | ICD-10-CM

## 2013-09-24 DIAGNOSIS — Z87891 Personal history of nicotine dependence: Secondary | ICD-10-CM

## 2013-09-24 DIAGNOSIS — F0390 Unspecified dementia without behavioral disturbance: Secondary | ICD-10-CM

## 2013-09-24 DIAGNOSIS — Z66 Do not resuscitate: Secondary | ICD-10-CM | POA: Diagnosis present

## 2013-09-24 DIAGNOSIS — Z950 Presence of cardiac pacemaker: Secondary | ICD-10-CM

## 2013-09-24 DIAGNOSIS — E876 Hypokalemia: Secondary | ICD-10-CM

## 2013-09-24 DIAGNOSIS — R531 Weakness: Secondary | ICD-10-CM

## 2013-09-24 LAB — COMPREHENSIVE METABOLIC PANEL
AST: 27 U/L (ref 0–37)
Albumin: 3.1 g/dL — ABNORMAL LOW (ref 3.5–5.2)
BUN: 14 mg/dL (ref 6–23)
Calcium: 9.3 mg/dL (ref 8.4–10.5)
Chloride: 95 mEq/L — ABNORMAL LOW (ref 96–112)
Creatinine, Ser: 0.99 mg/dL (ref 0.50–1.10)
GFR calc non Af Amer: 50 mL/min — ABNORMAL LOW (ref >90)
Potassium: 3.3 mEq/L — ABNORMAL LOW (ref 3.5–5.1)
Total Bilirubin: 0.9 mg/dL (ref 0.3–1.2)

## 2013-09-24 LAB — URINALYSIS, ROUTINE W REFLEX MICROSCOPIC
Glucose, UA: NEGATIVE mg/dL
Nitrite: NEGATIVE
Specific Gravity, Urine: >1.03 — ABNORMAL HIGH (ref 1.005–1.030)
pH: 6 (ref 5.0–8.0)

## 2013-09-24 LAB — CBC
Hemoglobin: 12.4 g/dL (ref 12.0–15.0)
MCH: 30.2 pg (ref 26.0–34.0)
MCHC: 33.9 g/dL (ref 30.0–36.0)
MCV: 89.1 fL (ref 78.0–100.0)
Platelets: 284 10*3/uL (ref 150–400)
RBC: 4.11 MIL/uL (ref 3.87–5.11)
WBC: 6.4 10*3/uL (ref 4.0–10.5)

## 2013-09-24 LAB — GLUCOSE, CAPILLARY
Glucose-Capillary: 72 mg/dL (ref 70–99)
Glucose-Capillary: 107 mg/dL — ABNORMAL HIGH (ref 70–99)

## 2013-09-24 LAB — URINE MICROSCOPIC-ADD ON

## 2013-09-24 MED ORDER — BENAZEPRIL HCL 10 MG PO TABS
10.0000 mg | ORAL_TABLET | Freq: Every day | ORAL | Status: DC
  Administered 2013-09-25 – 2013-09-27 (×3): 10 mg via ORAL
  Filled 2013-09-24 (×4): qty 1

## 2013-09-24 MED ORDER — POTASSIUM CHLORIDE IN NACL 20-0.9 MEQ/L-% IV SOLN
INTRAVENOUS | Status: DC
  Administered 2013-09-24 – 2013-09-27 (×4): via INTRAVENOUS

## 2013-09-24 MED ORDER — AMLODIPINE BESYLATE 5 MG PO TABS
10.0000 mg | ORAL_TABLET | Freq: Every day | ORAL | Status: DC
  Administered 2013-09-25 – 2013-09-27 (×3): 10 mg via ORAL
  Filled 2013-09-24 (×4): qty 2

## 2013-09-24 MED ORDER — LEVOTHYROXINE SODIUM 50 MCG PO TABS
50.0000 ug | ORAL_TABLET | Freq: Every day | ORAL | Status: DC
  Administered 2013-09-25 – 2013-09-28 (×4): 50 ug via ORAL
  Filled 2013-09-24 (×4): qty 1

## 2013-09-24 MED ORDER — POLYETHYLENE GLYCOL 3350 GRAN: 17.0000 g | GRANULES | Status: DC

## 2013-09-24 MED ORDER — ASPIRIN EC 81 MG PO TBEC
81.0000 mg | DELAYED_RELEASE_TABLET | Freq: Every day | ORAL | Status: DC
  Administered 2013-09-25 – 2013-09-27 (×3): 81 mg via ORAL
  Filled 2013-09-24 (×4): qty 1

## 2013-09-24 MED ORDER — SIMVASTATIN 10 MG PO TABS
10.0000 mg | ORAL_TABLET | Freq: Every day | ORAL | Status: DC
  Administered 2013-09-24 – 2013-09-27 (×4): 10 mg via ORAL
  Filled 2013-09-24 (×4): qty 1

## 2013-09-24 MED ORDER — ACETAMINOPHEN 650 MG RE SUPP: 650.0000 mg | Freq: Four times a day (QID) | RECTAL | Status: DC | PRN

## 2013-09-24 MED ORDER — NAPHAZOLINE-PHENIRAMINE 0.025-0.3 % OP SOLN
1.0000 [drp] | Freq: Three times a day (TID) | OPHTHALMIC | Status: DC | PRN
  Filled 2013-09-24: qty 15

## 2013-09-24 MED ORDER — MEMANTINE HCL 10 MG PO TABS
5.0000 mg | ORAL_TABLET | Freq: Two times a day (BID) | ORAL | Status: DC
  Administered 2013-09-24 – 2013-09-27 (×7): 5 mg via ORAL
  Filled 2013-09-24 (×8): qty 1

## 2013-09-24 MED ORDER — BIOTENE DRY MOUTH MT LIQD
15.0000 mL | Freq: Two times a day (BID) | OROMUCOSAL | Status: DC
Start: 2013-09-24 — End: 2013-09-28
  Administered 2013-09-25 – 2013-09-28 (×6): 15 mL via OROMUCOSAL

## 2013-09-24 MED ORDER — HALOPERIDOL 2 MG PO TABS
1.0000 mg | ORAL_TABLET | Freq: Two times a day (BID) | ORAL | Status: DC
  Administered 2013-09-24 – 2013-09-27 (×7): 1 mg via ORAL
  Filled 2013-09-24 (×8): qty 1

## 2013-09-24 MED ORDER — PANTOPRAZOLE SODIUM 40 MG PO TBEC
40.0000 mg | DELAYED_RELEASE_TABLET | Freq: Every day | ORAL | Status: DC
  Administered 2013-09-25 – 2013-09-27 (×3): 40 mg via ORAL
  Filled 2013-09-24 (×4): qty 1

## 2013-09-24 MED ORDER — ACETAMINOPHEN 325 MG PO TABS
650.0000 mg | ORAL_TABLET | Freq: Four times a day (QID) | ORAL | Status: DC | PRN
  Filled 2013-09-24: qty 2

## 2013-09-24 MED ORDER — ALLOPURINOL 100 MG PO TABS
100.0000 mg | ORAL_TABLET | Freq: Every day | ORAL | Status: DC
  Administered 2013-09-25 – 2013-09-27 (×3): 100 mg via ORAL
  Filled 2013-09-24 (×4): qty 1

## 2013-09-24 MED ORDER — POLYETHYLENE GLYCOL 3350 17 G PO PACK
17.0000 g | PACK | ORAL | Status: DC
  Administered 2013-09-25 – 2013-09-28 (×2): 17 g via ORAL
  Filled 2013-09-24 (×2): qty 1

## 2013-09-24 MED ORDER — INSULIN ASPART 100 UNIT/ML ~~LOC~~ SOLN: 0.0000 [IU] | Freq: Every day | SUBCUTANEOUS | Status: DC

## 2013-09-24 MED ORDER — INSULIN ASPART 100 UNIT/ML ~~LOC~~ SOLN
0.0000 [IU] | Freq: Three times a day (TID) | SUBCUTANEOUS | Status: DC
  Administered 2013-09-25 (×2): 1 [IU] via SUBCUTANEOUS
  Administered 2013-09-26: 3 [IU] via SUBCUTANEOUS
  Administered 2013-09-26: 1 [IU] via SUBCUTANEOUS
  Administered 2013-09-26: 7 [IU] via SUBCUTANEOUS
  Administered 2013-09-27: 5 [IU] via SUBCUTANEOUS
  Administered 2013-09-27 – 2013-09-28 (×3): 3 [IU] via SUBCUTANEOUS

## 2013-09-24 MED ORDER — METOPROLOL TARTRATE 25 MG PO TABS
25.0000 mg | ORAL_TABLET | Freq: Two times a day (BID) | ORAL | Status: DC
  Administered 2013-09-24 – 2013-09-27 (×7): 25 mg via ORAL
  Filled 2013-09-24 (×8): qty 1

## 2013-09-24 MED ORDER — ONDANSETRON HCL 4 MG/2ML IJ SOLN: 4.0000 mg | Freq: Four times a day (QID) | INTRAMUSCULAR | Status: DC | PRN

## 2013-09-24 MED ORDER — ENOXAPARIN SODIUM 40 MG/0.4ML ~~LOC~~ SOLN
40.0000 mg | SUBCUTANEOUS | Status: DC
  Administered 2013-09-24 – 2013-09-27 (×4): 40 mg via SUBCUTANEOUS
  Filled 2013-09-24 (×4): qty 0.4

## 2013-09-24 MED ORDER — ONDANSETRON HCL 4 MG PO TABS: 4.0000 mg | ORAL_TABLET | Freq: Four times a day (QID) | ORAL | Status: DC | PRN

## 2013-09-24 MED ORDER — DONEPEZIL HCL 5 MG PO TABS
10.0000 mg | ORAL_TABLET | Freq: Every day | ORAL | Status: DC
  Administered 2013-09-24 – 2013-09-27 (×4): 10 mg via ORAL
  Filled 2013-09-24 (×4): qty 2

## 2013-09-24 NOTE — Progress Notes (Signed)
[facility] History and Physical  [name] ZOX:[id number] DOB: 1925-12-14 DOA: 09/24/2013  Referring physician: Dr. Karilyn Cota PCP: Milana Obey, MD  Specialists:   Chief Complaint: Generalized weakness  HPI: [name] is a 79 y.o. female who is a resident of a facility, has dementia. Patient is a direct admission after being seen by Dr. Karilyn Cota today. History is limited due to mental status. The majority history is obtained from medical record, speaking to Dr. Karilyn Cota as well as staff at the facility. Patient has progressively been getting weak, lethargic and increasingly somnolent over the past one to 2 weeks. It is unclear whether she's been having any fevers. She's not had any nausea, vomiting, diarrhea, cough or shortness of breath. The staff feels that she may be developing "a cold". Her by mouth intake has been very poor over the past one to 2 weeks. She is refusing to eat. She sleeps for most of the day. She also has been losing weight. His becoming increasingly confused. There is no mention of dysuria. Since patient was felt to be clinically dehydrated, she was to the hospital for further evaluation and treatment  Review of Systems: Unable to assess due to mental status  Past Medical History  Diagnosis Date  . Diabetes mellitus     insulin dependant  . Hypertension   . Hyperlipidemia   . Paroxysmal atrial fibrillation   . GERD (gastroesophageal reflux disease)   . Dementia   . Cardiac pacemaker 2003, revised 2012    implant  . Hypothyroidism   . Renal insufficiency   . Asthma   . Gastroparesis 06/2011    abnormal GES, 80% retention at two hours  . Coronary artery disease   . Dementia   . Myocardial infarction    Past Surgical History  Procedure Laterality Date  . Pacemaker insertion    . Appendectomy    . Hernia repair  2007    incarcerated  . Hip fracture surgery  2003    left  . Esophagogastroduodenoscopy  07/09/2011    Procedure:  ESOPHAGOGASTRODUODENOSCOPY (EGD);  Surgeon: Arlyce Harman, MD;  Location: [facility];  Service: Endoscopy;  Laterality: N/A;.  Mild gastritis, schatzki ring, small hh   Social History:  reports that she has quit smoking. She has never used smokeless tobacco. She reports that she does not drink alcohol or use illicit drugs.   No Known Allergies  Family History  Problem Relation Age of Onset  . Diabetes Father   . Colon cancer Neg Hx     Prior to Admission medications   Medication Sig Start Date End Date Taking? Authorizing Provider  acetaminophen (TYLENOL) 500 MG tablet Take 500-1,000 mg by mouth every 4 (four) hours as needed. For pain/fever     Historical Provider, MD  allopurinol (ZYLOPRIM) 100 MG tablet Take 100 mg by mouth daily.     Historical Provider, MD  amLODipine (NORVASC) 10 MG tablet Take 10 mg by mouth daily.     Historical Provider, MD  aspirin EC 81 MG tablet Take 81 mg by mouth daily.      Historical Provider, MD  B Complex-C (B-COMPLEX WITH VITAMIN C) tablet Take 1 tablet by mouth daily.      Historical Provider, MD  benazepril (LOTENSIN) 10 MG tablet Take 10 mg by mouth daily.    Historical Provider, MD  donepezil (ARICEPT) 10 MG tablet Take 10 mg by mouth at bedtime.    Historical Provider, MD  ergocalciferol (VITAMIN D2)  50000 UNITS capsule Take 50,000 Units by mouth every Thursday. Take one capsule on Thursdays    Historical Provider, MD  haloperidol (HALDOL) 1 MG tablet Take 1 mg by mouth 2 (two) times daily.    Historical Provider, MD  levothyroxine (SYNTHROID, LEVOTHROID) 50 MCG tablet Take 50 mcg by mouth daily.     Historical Provider, MD  LORazepam (ATIVAN) 0.5 MG tablet Take 0.25 mg by mouth 3 (three) times daily.     Historical Provider, MD  memantine (NAMENDA) 5 MG tablet Take 5 mg by mouth 2 (two) times daily.    Historical Provider, MD  metoprolol tartrate (LOPRESSOR) 25 MG tablet Take 25 mg by mouth 2 (two) times daily.    Historical Provider, MD   naphazoline-pheniramine (NAPHCON-A) 0.025-0.3 % ophthalmic solution Place 1 drop into both eyes 3 (three) times daily as needed. For itching    Historical Provider, MD  omeprazole (PRILOSEC) 20 MG capsule Take 20 mg by mouth daily before breakfast.   06/27/11   Tiffany Kocher, PA-C  ondansetron (ZOFRAN) 8 MG tablet Take 8 mg by mouth every 6 (six) hours as needed. For nausea    Historical Provider, MD  Polyethylene Glycol 3350 GRAN Take 17 g by mouth every Monday, Wednesday, and Friday. Patient takes on Mon.,Wed.,Fri. 08/27/11   Tiffany Kocher, PA-C  pravastatin (PRAVACHOL) 20 MG tablet Take 20 mg by mouth every evening.    Historical Provider, MD  vitamin B-12 (CYANOCOBALAMIN) 1000 MCG tablet Take 2,000 mcg by mouth daily.      Historical Provider, MD   Physical Exam: Filed Vitals:   09/24/13 1408  BP: 115/58  Pulse: 65  Temp: 97.4 F (36.3 C)  Resp: 20     General:  NAD drowsy, falls asleep in between questions  Eyes: PERRLA  ENT: mucous membranes are dry  Neck: Supple  Cardiovascular: S1, S2, regular rate and rhythm  Respiratory: Clear to auscultation bilaterally  Abdomen: Soft, nontender, positive bowel sounds  Skin: No visible rashes  Musculoskeletal: No pedal edema bilaterally  Psychiatric: Unable to assess due to mental status  Neurologic: Patient does not cooperate with neurologic exam, no gross focal signs  Labs on Admission:  Basic Metabolic Panel:  Recent Labs Lab 09/24/13 1530  NA 131*  K 3.3*  CL 95*  CO2 29  GLUCOSE 73  BUN 14  CREATININE 0.99  CALCIUM 9.3   Liver Function Tests:  Recent Labs Lab 09/24/13 1530  AST 27  ALT 15  ALKPHOS 81  BILITOT 0.9  PROT 6.8  ALBUMIN 3.1*   No results found for this basename: LIPASE, AMYLASE,  in the last 168 hours No results found for this basename: AMMONIA,  in the last 168 hours CBC:  Recent Labs Lab 09/24/13 1530  WBC 6.4  HGB 12.4  HCT 36.6  MCV 89.1  PLT 284   Cardiac Enzymes: No  results found for this basename: CKTOTAL, CKMB, CKMBINDEX, TROPONINI,  in the last 168 hours  BNP (last 3 results) No results found for this basename: PROBNP,  in the last 8760 hours CBG:  Recent Labs Lab 09/24/13 1616  GLUCAP 72    Radiological Exams on Admission: No results found.  Assessment/Plan Active Problems:   Atrial fibrillation   Dehydration   Generalized weakness   Hypokalemia   Type II or unspecified type diabetes mellitus without mention of complication, not stated as uncontrolled   Acute encephalopathy   Dementia   1.  acute metabolic encephalopathy, likely  superimposed on underlying dementia. Etiology is likely due to volume depletion and dehydration. Her by mouth intake has been very poor. Lab work is relatively unremarkable. Urinalysis currently pending as is chest x-ray. Continue with IV fluids and monitor for improvement.  2.  dehydration due to poor by mouth intake. Continue IV fluids. 3. Hypokalemia. Likely due to decreased by mouth intake. Replace. 4. Failure to thrive. Likely related to progression of underlying dementia. I discussed this a sister who has requested that all treatments be made available to the patient and in that she is a full code. I strongly urged her goals of care/and gastritis. Discussed with this patient and her sister. A DO NOT RESUSCITATE appears to be appropriate in this situation. Will defer further conversations to the patient's primary care doctor. 5. Diabetes. Continue sliding scale insulin.  Code Status: full code Family Communication: discussed with patient's sister Ms. Blackwell over the phone Disposition Plan: return to SNF when ready  Time spent:  [facility] Pager [phone]  If 7PM-7AM, please contact night-coverage [email or web address] Password [email or web address] 09/24/2013, 5:07 PM

## 2013-09-25 ENCOUNTER — Encounter (HOSPITAL_COMMUNITY): Payer: Medicare Other

## 2013-09-25 ENCOUNTER — Inpatient Hospital Stay (HOSPITAL_COMMUNITY): Payer: Medicare Other

## 2013-09-25 LAB — TSH: TSH: 2.367 u[IU]/mL (ref 0.350–4.500)

## 2013-09-25 LAB — GLUCOSE, CAPILLARY
Glucose-Capillary: 130 mg/dL — ABNORMAL HIGH (ref 70–99)
Glucose-Capillary: 89 mg/dL (ref 70–99)
Glucose-Capillary: 108 mg/dL — ABNORMAL HIGH (ref 70–99)

## 2013-09-25 LAB — CBC
MCHC: 33.5 g/dL (ref 30.0–36.0)
MCV: 89.6 fL (ref 78.0–100.0)
Platelets: 292 10*3/uL (ref 150–400)
RBC: 3.96 MIL/uL (ref 3.87–5.11)
RDW: 14.6 % (ref 11.5–15.5)
WBC: 6.3 10*3/uL (ref 4.0–10.5)

## 2013-09-25 LAB — COMPREHENSIVE METABOLIC PANEL
Albumin: 3 g/dL — ABNORMAL LOW (ref 3.5–5.2)
Alkaline Phosphatase: 84 U/L (ref 39–117)
BUN: 14 mg/dL (ref 6–23)
CO2: 27 mEq/L (ref 19–32)
Chloride: 97 mEq/L (ref 96–112)
GFR calc Af Amer: 66 mL/min — ABNORMAL LOW (ref >90)
GFR calc non Af Amer: 57 mL/min — ABNORMAL LOW (ref >90)
Glucose, Bld: 143 mg/dL — ABNORMAL HIGH (ref 70–99)
Potassium: 4 mEq/L (ref 3.5–5.1)
Total Bilirubin: 1.2 mg/dL (ref 0.3–1.2)

## 2013-09-25 MED ORDER — DEXTROSE 5 % IV SOLN
1.0000 g | INTRAVENOUS | Status: DC
  Administered 2013-09-25 – 2013-09-27 (×3): 1 g via INTRAVENOUS
  Filled 2013-09-25 (×4): qty 10

## 2013-09-25 MED ORDER — BOOST / RESOURCE BREEZE PO LIQD
1.0000 | Freq: Three times a day (TID) | ORAL | Status: DC
  Administered 2013-09-25 – 2013-09-28 (×10): 1 via ORAL

## 2013-09-25 NOTE — Progress Notes (Signed)
Utilization Review Complete  

## 2013-09-25 NOTE — Progress Notes (Signed)
Pt pulled out her IV. Pt is a hard stick. [facility] RN will attempt to place new peripheral IV.

## 2013-09-25 NOTE — Progress Notes (Signed)
Resistance was felt during the last part of insertion with pt's PICC line. Pt's PICC line in left basilic vein turned back on itself with the tip lying in the brachiocephalic vein as shown by CXR. PICC line was withdrawn 2cm and power flushed with 75ml's of saline. Another CXR was done, with no change in PICC line placement. Dr. Sudie Bailey was called and discussed the pt's PICC line problems and was asked if we could convert it to a Midline catheter. Order was given for Midline catheter. IV team at [facility] was called by Leodis Rains, RN and Chip Boer, RN from the [facility] IV Team said it would be okay to pull this same PICC line back and leave it wrapped under the dressing to convert it a Midline instead of doing a PICC exchange to a Midline. Pt's PICC was pulled out 20cm and left wrapped under the dressing. [facility] Team is to be called for pt's dressing change on Wed. 09/30/13 or prn due to the nature of the wrapped PICC line underneath the dressing. Pt tolerated procedure well. This was discussed with pt's nurse, Benjaman Lobe, RN.

## 2013-09-25 NOTE — Clinical Social Work Psychosocial (Signed)
    Clinical Social Work Department BRIEF PSYCHOSOCIAL ASSESSMENT 09/25/2013  Patient:  CARRINGTON, MULLENAX     Account Number:  [id number]     Admit date:  09/24/2013  Clinical Social Worker:  Santa Genera, CLINICAL SOCIAL WORKER  Date/Time:  09/25/2013 03:30 PM  Referred by:  Physician  Date Referred:  09/25/2013 Referred for  ALF Placement   Other Referral:   Interview type:  Other - See comment Other interview type:   Called [facility] staff, unable to reach family    PSYCHOSOCIAL DATA Living Status:  FACILITY Admitted from facility:  OTHER Level of care:  Family Care Home Primary support name:  Damien Fusi Primary support relationship to patient:  SIBLING Degree of support available:   Resident of [facility]    CURRENT CONCERNS Current Concerns  Post-Acute Placement   Other Concerns:    SOCIAL WORK ASSESSMENT / PLAN CSW unable to assess patient due to dementia and limited orientation, tried to call sister listed on facesheet. Left VM 2x, no return call.  Was able to reach Lodema Hong, administrator at [facility] where patient has resided since "the 8s."  Patient currently has dementia, is able to walk w assistance, facility assists w bathing, toileting and dressing.  When not ill, patient is able to feed herself.  May require verbal prompts occasionally. Took patient to MD on thursday, MD advised admission to hospital.  Questioned facility about RN concerns about brusing on arms - Gwynn states she had no bruises when at facility, has had no falls or other issues.    Facility wants PT evaluation, they are concerned that they cannot manage her care needs at present.  CSW stated that they would need to allow resident to return to facility and could give 30 day notice if desired.  Facility says they do not transport on weekend and do not usually take weekend admissions, including return to facility.  CSW advised that [facility] may need to send patient back via  alternative transport if patient has no care changes and return to facility could be safely accomplished, Gwynn agreeable.    PT consult requested from RN CM.   Assessment/plan status:  Psychosocial Support/Ongoing Assessment of Needs Other assessment/ plan:   Information/referral to community resources:   Family care home return to facility    PATIENT'S/FAMILY'S RESPONSE TO PLAN OF CARE: Facility requested PT consult.  Unable to reach famliy or directly assess patient.  Advised weekend RN of need to speak w family.        Santa Genera, LCSW Clinical Social Worker [phone])

## 2013-09-25 NOTE — Progress Notes (Signed)
Patient refuses to sit up for breakfast at this time.  Will not drink any of the clears provided.  Will not answer appropriately and just pull the covers over her head.  Will attempt to encourage PO intake again later this morning

## 2013-09-25 NOTE — Progress Notes (Addendum)
INITIAL NUTRITION ASSESSMENT  DOCUMENTATION CODES Per approved criteria  -Severe malnutrition in the context of acute on chronic illness   Pt meets criteria for severe MALNUTRITION in the context of acute on chronic illness as evidenced by wt loss of 9#,6% x 9 days, energy intake </= 50% for >/= 5 days.  INTERVENTION: Resource Breeze po TID, each supplement provides 250 kcal and 9 grams of protein. Assist with feeding and gentle encouragement with meal intake RD will follow diet advancement and nutrition care  NUTRITION DIAGNOSIS: Malnutrition (severe) related to inadequate oral intake, dementia disease progression as evidenced by FTT, dehydration, very poor po intake, severe wt loss and dementia at baseline.   Goal: Meet nutrition needs as able.  Monitor:  Meal intake, weight changes Labs, skin assessments  Reason for Assessment: Malnutrition Screen Score = 2  79 y.o. female   ASSESSMENT: pt is from [facility] She has hx dementia, gastroparesis, unintentional wt loss (acute) 9#,6% x 9 days which is severe. She presented to ED 9/30 c/o weakness. Admitted yesterday directly from Dr. Karilyn Cota with continued weakness, dehydration. Very poor po intake x 2 weeks which coincides with pt acute wt loss. Spoke with staff where she resides to obtain recent diet hx. Pt needs assistance with feeding and continued prompting during meals. She has been taking oatmeal, applesauce, eggs, bananas, juice.  She is refusing clear liquids this morning. Unable to complete nutrition focused exam. She has her head covered and tells me "you better get out of here". No family members present at this time.    Patient Active Problem List   Diagnosis Date Noted  . Dehydration 09/24/2013  . Generalized weakness 09/24/2013  . Hypokalemia 09/24/2013  . Type II or unspecified type diabetes mellitus without mention of complication, not stated as uncontrolled 09/24/2013  . Acute encephalopathy 09/24/2013   . Dementia 09/24/2013  . Atrial fibrillation 05/23/2012  . Gastritis 11/29/2011  . Diabetic gastroparesis 08/27/2011  . Constipation 08/27/2011  . Epigastric pain 06/27/2011  . Bradycardia 05/22/2011  . Pacemaker 05/22/2011  . Weight loss, non-intentional 05/22/2011  . Nausea 05/22/2011    Height: Ht Readings from Last 1 Encounters:  09/24/13 5\' 6"  (1.676 m)    Weight: Wt Readings from Last 1 Encounters:  09/24/13 135 lb (61.236 kg)    Ideal Body Weight: 130# (59 kg)  % Ideal Body Weight: 104%  Wt Readings from Last 10 Encounters:  09/24/13 135 lb (61.236 kg)  09/15/13 144 lb (65.318 kg)  01/13/13 144 lb (65.318 kg)  08/31/12 144 lb (65.318 kg)  05/23/12 144 lb 1.9 oz (65.372 kg)  12/09/11 148 lb 3.2 oz (67.223 kg)  11/28/11 142 lb 6.4 oz (64.592 kg)  08/27/11 144 lb 12.8 oz (65.681 kg)  08/18/11 124 lb (56.246 kg)  06/27/11 146 lb 9.6 oz (66.497 kg)    Usual Body Weight: 144#  % Usual Body Weight: 94%  BMI:  Body mass index is 21.8 kg/(m^2).normal range  Estimated Nutritional Needs: Kcal: 1500-1750  Protein: 65-72 gr Fluid:1800 ml/day  Skin: wound to right toe (type?)  Diet Order: Clear Liquid  EDUCATION NEEDS: -Education not appropriate at this time   Intake/Output Summary (Last 24 hours) at 09/25/13 0945 Last data filed at 09/24/13 1930  Gross per 24 hour  Intake    100 ml  Output      0 ml  Net    100 ml    Last BM: PTA  Labs:   Recent Labs Lab 09/24/13  1530 09/25/13 0537  NA 131* 134*  K 3.3* 4.0  CL 95* 97  CO2 29 27  BUN 14 14  CREATININE 0.99 0.89  CALCIUM 9.3 9.0  GLUCOSE 73 143*    CBG (last 3)   Recent Labs  09/24/13 1616 09/24/13 2122 09/25/13 0743  GLUCAP 72 107* 130*    Scheduled Meds: . allopurinol  100 mg Oral Daily  . amLODipine  10 mg Oral Daily  . antiseptic oral rinse  15 mL Mouth Rinse BID  . aspirin EC  81 mg Oral Daily  . benazepril  10 mg Oral Daily  . cefTRIAXone (ROCEPHIN) IVPB 1 gram/50  mL D5W  1 g Intravenous Q24H  . donepezil  10 mg Oral QHS  . enoxaparin (LOVENOX) injection  40 mg Subcutaneous Q24H  . haloperidol  1 mg Oral BID  . insulin aspart  0-5 Units Subcutaneous QHS  . insulin aspart  0-9 Units Subcutaneous TID WC  . levothyroxine  50 mcg Oral QAC breakfast  . memantine  5 mg Oral BID  . metoprolol tartrate  25 mg Oral BID  . pantoprazole  40 mg Oral Daily  . polyethylene glycol  17 g Oral Q M,W,F  . simvastatin  10 mg Oral q1800    Continuous Infusions: . 0.9 % NaCl with KCl 20 mEq / L Stopped (09/25/13 0330)    Past Medical History  Diagnosis Date  . Diabetes mellitus     insulin dependant  . Hypertension   . Hyperlipidemia   . Paroxysmal atrial fibrillation   . GERD (gastroesophageal reflux disease)   . Dementia   . Cardiac pacemaker 2003, revised 2012    implant  . Hypothyroidism   . Renal insufficiency   . Asthma   . Gastroparesis 06/2011    abnormal GES, 80% retention at two hours  . Coronary artery disease   . Dementia   . Myocardial infarction     Past Surgical History  Procedure Laterality Date  . Pacemaker insertion    . Appendectomy    . Hernia repair  2007    incarcerated  . Hip fracture surgery  2003    left  . Esophagogastroduodenoscopy  07/09/2011    Procedure: ESOPHAGOGASTRODUODENOSCOPY (EGD);  Surgeon: Arlyce Harman, MD;  Location: [facility];  Service: Endoscopy;  Laterality: N/A;.  Mild gastritis, schatzki ring, small hh    Royann Shivers MS,RD,LDN,CSG Office: [phone] Pager: [phone]

## 2013-09-26 DIAGNOSIS — E43 Unspecified severe protein-calorie malnutrition: Secondary | ICD-10-CM | POA: Insufficient documentation

## 2013-09-26 LAB — CBC
MCH: 30.2 pg (ref 26.0–34.0)
MCHC: 33.5 g/dL (ref 30.0–36.0)
MCV: 90.2 fL (ref 78.0–100.0)
Platelets: 286 10*3/uL (ref 150–400)
RDW: 14.6 % (ref 11.5–15.5)
WBC: 8.2 10*3/uL (ref 4.0–10.5)

## 2013-09-26 LAB — GLUCOSE, CAPILLARY
Glucose-Capillary: 316 mg/dL — ABNORMAL HIGH (ref 70–99)
Glucose-Capillary: 133 mg/dL — ABNORMAL HIGH (ref 70–99)
Glucose-Capillary: 95 mg/dL (ref 70–99)

## 2013-09-26 LAB — BASIC METABOLIC PANEL
Calcium: 8.9 mg/dL (ref 8.4–10.5)
Chloride: 92 mEq/L — ABNORMAL LOW (ref 96–112)
Creatinine, Ser: 0.87 mg/dL (ref 0.50–1.10)
GFR calc Af Amer: 68 mL/min — ABNORMAL LOW (ref >90)
GFR calc non Af Amer: 59 mL/min — ABNORMAL LOW (ref >90)

## 2013-09-26 LAB — URINE CULTURE

## 2013-09-26 NOTE — Progress Notes (Signed)
NAMESWAYZEE, WADLEY            ACCOUNT NO.:  [id number]  MEDICAL RECORD NO.:  [id number]  LOCATION:  A318                          FACILITY:  [facility]  PHYSICIAN:  Mila Homer. Sudie Bailey, M.D.DATE OF BIRTH:  02/25/1925  DATE OF PROCEDURE: DATE OF DISCHARGE:                                PROGRESS NOTE   SUBJECTIVE:  This 79 year old was admitted yesterday with dehydration and a probable urinary tract infection.  OBJECTIVE:  VITAL SIGNS:  Today temperature is 97.8, pulse 60, respiratory 20, blood pressure 133/93. GENERAL:  She is curled up in bed.  She is responding, but not alert. HEART:  Regular rhythm.  Rate of 70. LUNGS:  Appear to be clear throughout.  She is moving air well. ABDOMEN:  Soft without tenderness. EXTREMITIES:  There is no edema of the ankles.  LABORATORY DATA:  Her blood tests today showed a sodium 134, from 131; potassium 4.0, up from 3.3.  Her hemoglobin is 11.9 down from 12.4.  UA culture results are pending.  The urinalysis did show too numerous to count wbc's with 3-6 rbc's and many bacteria.  ASSESSMENT: 1. Dehydration, improved. 2. Presumptive urinary tract infection. 3. Hypokalemia. 4. Senile dementia.  PLAN:  I am decreasing her IVs down from 100 mL an hour to 75 mL an hour.  She does have a PICC line in place.  I have added ceftriaxone at 1 g IV q.24 hours.  We will recheck her CBC and BMP in the morning.     Mila Homer. Sudie Bailey, M.D.     SDK/MEDQ  D:  09/25/2013  T:  09/26/2013  Job:  [id number]

## 2013-09-26 NOTE — Progress Notes (Signed)
Subjective: Patient is resting. She is receiving Iv antibiotic for UTI. Her po intake remained poor.  Objective: Vital signs in last 24 hours: Temp:  [97.8 F (36.6 C)-98 F (36.7 C)] 97.8 F (36.6 C) (10/11 0500) Pulse Rate:  [51-84] 84 (10/11 0500) Resp:  [18-20] 18 (10/11 0500) BP: (133-183)/(34-65) 141/34 mmHg (10/11 0500) SpO2:  [92 %-97 %] 92 % (10/11 0500) Weight change:  Last BM Date:  (unknown)  Intake/Output from previous day:    PHYSICAL EXAM General appearance: alert Resp: clear to auscultation bilaterally Cardio: S1, S2 normal GI: soft, non-tender; bowel sounds normal; no masses,  no organomegaly Extremities: extremities normal, atraumatic, no cyanosis or edema  Lab Results:    @labtest @ [facility] No results found for this basename: PHART, PCO2, PO2ART, TCO2, HCO3,  in the last 72 hours CULTURES Recent Results (from the past 240 hour(s))  MRSA PCR SCREENING     Status: None   Collection Time    09/24/13  3:30 PM      Result Value Range Status   MRSA by PCR NEGATIVE  NEGATIVE Final   Comment:            The GeneXpert MRSA Assay (FDA     approved for NASAL specimens     only), is one component of a     comprehensive MRSA colonization     surveillance program. It is not     intended to diagnose MRSA     infection nor to guide or     monitor treatment for     MRSA infections.  URINE CULTURE     Status: None   Collection Time    09/24/13  4:30 PM      Result Value Range Status   Specimen Description URINE, CLEAN CATCH   Final   Special Requests NONE   Final   Culture  Setup Time     Final   Value: 09/24/2013 18:24     Performed at [facility] Count     Final   Value: >=100,000 COLONIES/ML     Performed at [facility]   Culture     Final   Value: ESCHERICHIA COLI     Performed at [facility]   Report Status 09/26/2013 FINAL   Final   Organism ID, Bacteria ESCHERICHIA COLI   Final   Studies/Results: Dg Chest  Port 1 View  09/25/2013   CLINICAL DATA:  PICC placement  EXAM: PORTABLE CHEST - 1 VIEW  COMPARISON:  09/24/2013  FINDINGS: A left PICC extends to the superior vena cava but curls back on itself to have its tip in the left brachiocephalic vein.  Cardiac silhouette is normal in size and configuration.  Lungs are clear. No pleural effusion or pneumothorax.  Stable right anterior chest wall sequential pacemaker.  IMPRESSION: Left PICC tip curls in the left brachiocephalic vein.   Electronically Signed   By: Amie Portland M.D.   On: 09/25/2013 14:43   Dg Chest Port 1 View  09/24/2013   CLINICAL DATA:  History of congestion. History of atrial fibrillation. History of diabetes, hypertension, and coronary artery disease.  EXAM: PORTABLE CHEST - 1 VIEW  COMPARISON:  09/15/2013 study.  FINDINGS: Cardiac silhouette is at the upper range of normal size. Ectasia and nonaneurysmal calcification of the thoracic aorta are seen. Dual lead transvenous pacemaker is in place with generator on the right. No pneumothorax is evident. Mediastinal and hilar contours appear stable. There  is slight upper lobe vessel prominence vascular congestion pattern accentuated by a semi-erect position. No consolidation or pleural effusion is evident. There is chronic elevation of the margin of the right hemidiaphragm.  IMPRESSION: Transvenous pacemaker in place.There is slight upper lobe vessel prominence vascular congestion pattern accentuated by a semi-erect position. No consolidation or pleural effusion is evident.   Electronically Signed   By: Onalee Hua  Call M.D.   On: 09/24/2013 17:36   Dg Chest Port 1v Same Day  09/25/2013   CLINICAL DATA:  PICC adjustment.  EXAM: PORTABLE CHEST - 1 VIEW SAME DAY  COMPARISON:  09/25/2013, 1418 hr.  FINDINGS: The left upper extremity PICC remains doubled over on itself in the left brachiocephalic vein. The tip terminates just lateral to the aortic arch. This is not in central position. Right subclavian is a  pacemaker apparatus.  IMPRESSION: Left upper extremity PICC remains in the left brachiocephalic vein doubled back.   Electronically Signed   By: Andreas Newport M.D.   On: 09/25/2013 15:28    Medications: I have reviewed the patient's current medications.  Assesment: Active Problems:   Atrial fibrillation   Dehydration   Generalized weakness   Hypokalemia   Type II or unspecified type diabetes mellitus without mention of complication, not stated as uncontrolled   Acute encephalopathy   Dementia   Protein-calorie malnutrition, severe    Plan: Continue iv rocephin Continue Iv hydration Nutritional support    LOS: 2 days   Fritz Cauthon 09/26/2013, 9:46 AM

## 2013-09-27 LAB — BASIC METABOLIC PANEL
BUN: 12 mg/dL (ref 6–23)
CO2: 25 mEq/L (ref 19–32)
Creatinine, Ser: 0.87 mg/dL (ref 0.50–1.10)
GFR calc Af Amer: 68 mL/min — ABNORMAL LOW (ref >90)
GFR calc non Af Amer: 59 mL/min — ABNORMAL LOW (ref >90)
Glucose, Bld: 239 mg/dL — ABNORMAL HIGH (ref 70–99)
Sodium: 133 mEq/L — ABNORMAL LOW (ref 135–145)

## 2013-09-27 LAB — GLUCOSE, CAPILLARY
Glucose-Capillary: 259 mg/dL — ABNORMAL HIGH (ref 70–99)
Glucose-Capillary: 191 mg/dL — ABNORMAL HIGH (ref 70–99)

## 2013-09-27 LAB — CLOSTRIDIUM DIFFICILE BY PCR: Toxigenic C. Difficile by PCR: NEGATIVE

## 2013-09-27 NOTE — Progress Notes (Signed)
Patient usually incontinent, but had not voided since early this morning around 0350 per charting.  Bladder scan revealed approximately 60.  MD notified, orders to I & O cath.  Cath resulted in 150 cc of very cloudy, straw colored urine.  MD notified of this and orders given to increase IVF to 100 cc and for BNP in AM. Will continue to monitor and make next shift aware.

## 2013-09-27 NOTE — Progress Notes (Signed)
Subjective: Patien is awake but confused and disoreinted.  Objective: Vital signs in last 24 hours: Temp:  [98 F (36.7 C)-102 F (38.9 C)] 99.9 F (37.7 C) (10/12 0545) Pulse Rate:  [62-67] 62 (10/12 0539) Resp:  [18] 18 (10/12 0539) BP: (146-158)/(47-50) 146/50 mmHg (10/12 0539) SpO2:  [93 %] 93 % (10/11 1458) Weight change:  Last BM Date: 09/27/13  Intake/Output from previous day: 10/11 0701 - 10/12 0700 In: 280 [P.O.:280] Out: -   PHYSICAL EXAM General appearance: alert Resp: clear to auscultation bilaterally Cardio: S1, S2 normal GI: soft, non-tender; bowel sounds normal; no masses,  no organomegaly Extremities: extremities normal, atraumatic, no cyanosis or edema  Lab Results:    @labtest @ [facility] No results found for this basename: PHART, PCO2, PO2ART, TCO2, HCO3,  in the last 72 hours CULTURES Recent Results (from the past 240 hour(s))  MRSA PCR SCREENING     Status: None   Collection Time    09/24/13  3:30 PM      Result Value Range Status   MRSA by PCR NEGATIVE  NEGATIVE Final   Comment:            The GeneXpert MRSA Assay (FDA     approved for NASAL specimens     only), is one component of a     comprehensive MRSA colonization     surveillance program. It is not     intended to diagnose MRSA     infection nor to guide or     monitor treatment for     MRSA infections.  URINE CULTURE     Status: None   Collection Time    09/24/13  4:30 PM      Result Value Range Status   Specimen Description URINE, CLEAN CATCH   Final   Special Requests NONE   Final   Culture  Setup Time     Final   Value: 09/24/2013 18:24     Performed at [facility] Count     Final   Value: >=100,000 COLONIES/ML     Performed at [facility]   Culture     Final   Value: ESCHERICHIA COLI     Performed at [facility]   Report Status 09/26/2013 FINAL   Final   Organism ID, Bacteria ESCHERICHIA COLI   Final  CLOSTRIDIUM DIFFICILE BY PCR      Status: None   Collection Time    09/27/13  9:33 AM      Result Value Range Status   C difficile by pcr NEGATIVE  NEGATIVE Final   Studies/Results: Dg Chest Port 1 View  09/25/2013   CLINICAL DATA:  PICC placement  EXAM: PORTABLE CHEST - 1 VIEW  COMPARISON:  09/24/2013  FINDINGS: A left PICC extends to the superior vena cava but curls back on itself to have its tip in the left brachiocephalic vein.  Cardiac silhouette is normal in size and configuration.  Lungs are clear. No pleural effusion or pneumothorax.  Stable right anterior chest wall sequential pacemaker.  IMPRESSION: Left PICC tip curls in the left brachiocephalic vein.   Electronically Signed   By: Amie Portland M.D.   On: 09/25/2013 14:43   Dg Chest Port 1v Same Day  09/25/2013   CLINICAL DATA:  PICC adjustment.  EXAM: PORTABLE CHEST - 1 VIEW SAME DAY  COMPARISON:  09/25/2013, 1418 hr.  FINDINGS: The left upper extremity PICC remains doubled over on itself in the  left brachiocephalic vein. The tip terminates just lateral to the aortic arch. This is not in central position. Right subclavian is a pacemaker apparatus.  IMPRESSION: Left upper extremity PICC remains in the left brachiocephalic vein doubled back.   Electronically Signed   By: Andreas Newport M.D.   On: 09/25/2013 15:28    Medications: I have reviewed the patient's current medications.  Assesment: Active Problems:   Atrial fibrillation   Dehydration   Generalized weakness   Hypokalemia   Type II or unspecified type diabetes mellitus without mention of complication, not stated as uncontrolled   Acute encephalopathy   Dementia   Protein-calorie malnutrition, severe    Plan: Continue iv rocephin Continue Iv hydration Nutritional support    LOS: 3 days   Jahseh Lucchese 09/27/2013, 12:52 PM

## 2013-09-27 NOTE — Plan of Care (Signed)
Problem: Phase II Progression Outcomes Goal: Progress activity as tolerated unless otherwise ordered Outcome: Completed/Met Date Met:  09/27/13 Pt OOB to chair with 2 assist

## 2013-09-28 LAB — BASIC METABOLIC PANEL
BUN: 17 mg/dL (ref 6–23)
CO2: 22 mEq/L (ref 19–32)
Chloride: 99 mEq/L (ref 96–112)
Glucose, Bld: 251 mg/dL — ABNORMAL HIGH (ref 70–99)
Potassium: 4.4 mEq/L (ref 3.5–5.1)
Sodium: 136 mEq/L (ref 135–145)

## 2013-09-28 LAB — GLUCOSE, CAPILLARY: Glucose-Capillary: 247 mg/dL — ABNORMAL HIGH (ref 70–99)

## 2013-09-28 MED ORDER — NITROFURANTOIN MONOHYD MACRO 100 MG PO CAPS: 100.0000 mg | ORAL_CAPSULE | Freq: Two times a day (BID) | ORAL | Status: DC

## 2013-09-28 MED ORDER — NITROFURANTOIN MONOHYD MACRO 100 MG PO CAPS
100.0000 mg | ORAL_CAPSULE | Freq: Two times a day (BID) | ORAL | Status: DC
  Filled 2013-09-28 (×5): qty 1

## 2013-09-28 MED ORDER — HALOPERIDOL 1 MG PO TABS: 1.0000 mg | ORAL_TABLET | Freq: Two times a day (BID) | ORAL | Status: AC

## 2013-09-28 MED ORDER — INSULIN GLARGINE 100 UNIT/ML ~~LOC~~ SOLN
10.0000 [IU] | Freq: Every day | SUBCUTANEOUS | Status: DC
  Filled 2013-09-28: qty 0.1

## 2013-09-28 NOTE — Clinical Social Work Note (Signed)
Spoke w patient's sister, Ms Amie Critchley, who makes decisions on behalf of patient due to patient's dementia.  Gave bed offers, including [facility] (yes), [facility] (considering).  Sister chose [facility], will inform MD.    Santa Genera, LCSW Clinical Social Worker [phone])

## 2013-09-28 NOTE — Progress Notes (Signed)
Doing patient assessment today notice multiple bruises to right upper arm. Dark purplish bruises looks like hand and finger prints. Patient has had theses bruises prior to coming to hospital. Reported it to RN when patient came in on Oct 9th. Patient being transferred to [facility] late this evening. When giving report will inform nurse and make aware.

## 2013-09-28 NOTE — Evaluation (Signed)
Physical Therapy Evaluation Patient Details Name: [name] MRN: [id number] DOB: [birth year] Today's Date: 09/28/2013 Time: 4098-1191 PT Time Calculation (min): 28 min  PT Assessment / Plan / Recommendation History of Present Illness  Pt is a resident of a rest home, has baseline dementia.  She is admitted with increased confusion which is thought to be from a UTI.  At baseline, she is ambulatory with a walker but needs assist with ADLs.  Clinical Impression   Pt is seen for evaluation.  She is alert and fairly cooperative.  She was able to follow all instructions.  She is significantly deconditioned and generally weak.  She is now unable to take more than a few steps with a walker, mod assist.  Gait is very unstable.  She would benefit from SNF to regain her prior functional level.    PT Assessment  Patient needs continued PT services    Follow Up Recommendations  SNF    Does the patient have the potential to tolerate intense rehabilitation    no  Barriers to Discharge        Equipment Recommendations  None recommended by PT    Recommendations for Other Services     Frequency Min 3X/week    Precautions / Restrictions Precautions Precautions: Fall Restrictions Weight Bearing Restrictions: No   Pertinent Vitals/Pain       Mobility  Bed Mobility Bed Mobility: Supine to Sit Supine to Sit: 4: Min assist;HOB flat Transfers Transfers: Sit to Stand;Stand to Sit Sit to Stand: 3: Mod assist;With upper extremity assist;From bed Stand to Sit: 3: Mod assist;To chair/3-in-1;Without upper extremity assist Details for Transfer Assistance: pt had significant difficulty standing due to generalized weakness...unable to fully extend knees in stance Ambulation/Gait Ambulation/Gait Assistance: 3: Mod assist Ambulation Distance (Feet): 3 Feet Assistive device: Rolling walker Gait Pattern: Decreased step length - right;Decreased step length - left;Decreased hip/knee flexion -  left;Decreased hip/knee flexion - right;Right flexed knee in stance;Left flexed knee in stance;Trunk flexed Gait velocity: very slow and labored General Gait Details: gait is very unstable Stairs: No Wheelchair Mobility Wheelchair Mobility: No    Exercises     PT Diagnosis: Difficulty walking;Abnormality of gait;Generalized weakness  PT Problem List: Decreased strength;Decreased activity tolerance;Decreased mobility PT Treatment Interventions: Gait training;Functional mobility training;Therapeutic exercise     PT Goals(Current goals can be found in the care plan section) Acute Rehab PT Goals Patient Stated Goal: none stated PT Goal Formulation: Patient unable to participate in goal setting Time For Goal Achievement: 10/12/13 Potential to Achieve Goals: Good  Visit Information  Last PT Received On: 09/28/13 History of Present Illness: Pt is a resident of a rest home, has baseline dementia.  She is admitted with increased confusion which is thought to be from a UTI.  At baseline, she is ambulatory with a walker but needs assist with ADLs.       Prior Functioning  Home Living Family/patient expects to be discharged to:: Group home Prior Function Level of Independence: Needs assistance Gait / Transfers Assistance Needed: my understanding is that pt ambulates independently with a walker ADL's / Homemaking Assistance Needed: needs assist with all ADLs (except feeding) Communication Communication: No difficulties    Cognition  Cognition Arousal/Alertness: Awake/alert Behavior During Therapy: WFL for tasks assessed/performed Overall Cognitive Status: History of cognitive impairments - at baseline    Extremity/Trunk Assessment Lower Extremity Assessment Lower Extremity Assessment: Generalized weakness Cervical / Trunk Assessment Cervical / Trunk Assessment: Kyphotic   Balance Balance Balance Assessed:  No  End of Session PT - End of Session Equipment Utilized During Treatment:  Gait belt Activity Tolerance: Patient limited by fatigue Patient left: in chair;with call bell/phone within reach;with chair alarm set  GP     Myrlene Broker L 09/28/2013, 9:20 AM

## 2013-09-28 NOTE — Progress Notes (Signed)
[name] ZOX:[id number] DOB: July 17, 1925 DOA: 09/24/2013 PCP: Milana Obey, MD   Subjective: This lady is much improved from when I had seen her in the office. She is eating to some degree. She wants to get out of bed. We are waiting physical therapy evaluation. The assisted living facility is concerned that they may not be able to look after her due to her deterioration in dementia and inability to perform activities of daily living.           Physical Exam: Blood pressure 171/57, pulse 64, temperature 99 F (37.2 C), temperature source Oral, resp. rate 20, height 5\' 6"  (1.676 m), weight 61.236 kg (135 lb), SpO2 100.00%. She looks more alert. Heart sounds are present without murmurs. Lung fields appear to be clear although she has what I think is slightly increased work of breathing. Abdomen is soft nontender.   Investigations:  Recent Results (from the past 240 hour(s))  MRSA PCR SCREENING     Status: None   Collection Time    09/24/13  3:30 PM      Result Value Range Status   MRSA by PCR NEGATIVE  NEGATIVE Final   Comment:            The GeneXpert MRSA Assay (FDA     approved for NASAL specimens     only), is one component of a     comprehensive MRSA colonization     surveillance program. It is not     intended to diagnose MRSA     infection nor to guide or     monitor treatment for     MRSA infections.  URINE CULTURE     Status: None   Collection Time    09/24/13  4:30 PM      Result Value Range Status   Specimen Description URINE, CLEAN CATCH   Final   Special Requests NONE   Final   Culture  Setup Time     Final   Value: 09/24/2013 18:24     Performed at [facility] Count     Final   Value: >=100,000 COLONIES/ML     Performed at [facility]   Culture     Final   Value: ESCHERICHIA COLI     Performed at [facility]   Report Status 09/26/2013 FINAL   Final   Organism ID, Bacteria ESCHERICHIA COLI   Final   CLOSTRIDIUM DIFFICILE BY PCR     Status: None   Collection Time    09/27/13  9:33 AM      Result Value Range Status   C difficile by pcr NEGATIVE  NEGATIVE Final     Basic Metabolic Panel:  Recent Labs  81/19/14 0617 09/28/13 0539  NA 133* 136  K 4.3 4.4  CL 95* 99  CO2 25 22  GLUCOSE 239* 251*  BUN 12 17  CREATININE 0.87 0.89  CALCIUM 8.7 8.7      CBC:  Recent Labs  09/26/13 0635  WBC 8.2  HGB 12.4  HCT 37.0  MCV 90.2  PLT 286    No results found.    Medications: I have reviewed the patient's current medications.  Impression: 1. Escherichia coli UTI, likely causing her failure to thrive. 2. Moderate to severe dementia. 3. Type 2 diabetes mellitus, on sliding scale of insulin. Her baseline insulin requirement is approximately 10 units daily. 4. Protein calorie malnutrition, severe. 5. Clinical dehydration, improved.  Plan: 1. Reduce IV fluids to [facility] and encourage oral intake. 2. Await physical therapy evaluation to assess for mobility and best placement options. 3. Depending on physical therapy recommendations, I think she should be stable for discharge soon, wherever she is going.  Consultants:  None.   Procedures:  None.   Antibiotics:  Rocephin IV discontinued 09/28/2013.  Macrobid orally started today, 09/28/2013.                  Code Status: Full code.  Family Communication: No family members at the bedside.   Disposition Plan: Depending on physical therapy recommendations.  Time spent: 20 minutes.   LOS: 4 days   Wilson Singer Pager [phone]  09/28/2013, 8:54 AM

## 2013-09-28 NOTE — Discharge Summary (Signed)
Physician Discharge Summary  Patient ID: [name] MRN: [id number] DOB/AGE: 77-Mar-1926 77 y.o. Primary Care Physician:KNOWLTON,STEPHEN D, MD Admit date: 09/24/2013 Discharge date: 09/28/2013    Discharge Diagnoses:  1. Escherichia coli UTI. 2. Acute encephalopathy secondary to #1 and underlying moderate to severe dementia with poor functional status. 3. Protein calorie malnutrition, severe. 4. Type 2 diabetes mellitus with episodes of hypoglycemia secondary to poor oral intake.     Medication List    STOP taking these medications       LORazepam 0.5 MG tablet  Commonly known as:  ATIVAN      TAKE these medications       acetaminophen 500 MG tablet  Commonly known as:  TYLENOL  Take 500-1,000 mg by mouth every 4 (four) hours as needed. For pain/fever     allopurinol 100 MG tablet  Commonly known as:  ZYLOPRIM  Take 100 mg by mouth daily.     amLODipine 10 MG tablet  Commonly known as:  NORVASC  Take 10 mg by mouth daily.     aspirin EC 81 MG tablet  Take 81 mg by mouth daily.     B-complex with vitamin C tablet  Take 1 tablet by mouth daily.     benazepril 10 MG tablet  Commonly known as:  LOTENSIN  Take 10 mg by mouth daily.     donepezil 10 MG tablet  Commonly known as:  ARICEPT  Take 10 mg by mouth at bedtime.     ergocalciferol 50000 UNITS capsule  Commonly known as:  VITAMIN D2  Take 50,000 Units by mouth every Thursday. Take one capsule on Thursdays     haloperidol 1 MG tablet  Commonly known as:  HALDOL  Take 1 mg by mouth 2 (two) times daily.     insulin aspart 100 UNIT/ML injection  Commonly known as:  novoLOG  Inject 5 Units into the skin 2 (two) times daily. Gets at 0730 before breakfast and at 1530 before supper.     levothyroxine 50 MCG tablet  Commonly known as:  SYNTHROID, LEVOTHROID  Take 50 mcg by mouth daily.     memantine 5 MG tablet  Commonly known as:  NAMENDA  Take 5 mg by mouth 2 (two) times daily.     metoprolol  tartrate 25 MG tablet  Commonly known as:  LOPRESSOR  Take 25 mg by mouth 2 (two) times daily.     naphazoline-pheniramine 0.025-0.3 % ophthalmic solution  Commonly known as:  NAPHCON-A  Place 1 drop into both eyes 3 (three) times daily as needed. For itching     nitrofurantoin (macrocrystal-monohydrate) 100 MG capsule  Commonly known as:  MACROBID  Take 1 capsule (100 mg total) by mouth every 12 (twelve) hours.     omeprazole 20 MG capsule  Commonly known as:  PRILOSEC  Take 20 mg by mouth daily before breakfast.     ondansetron 8 MG tablet  Commonly known as:  ZOFRAN  Take 8 mg by mouth every 6 (six) hours as needed. For nausea     Polyethylene Glycol 3350 Gran  Take 17 g by mouth every Monday, Wednesday, and Friday. Patient takes on Mon.,Wed.,Fri.     pravastatin 20 MG tablet  Commonly known as:  PRAVACHOL  Take 20 mg by mouth every evening.     vitamin B-12 1000 MCG tablet  Commonly known as:  CYANOCOBALAMIN  Take 2,000 mcg by mouth daily.        Discharged Condition:  Stable.    Consults: None.  Significant Diagnostic Studies: Dg Chest 2 View  09/15/2013   CLINICAL DATA:  Cough, congestion and weakness.  EXAM: CHEST - 2 VIEW  COMPARISON:  08/23/2013  FINDINGS: A pacemaker shows a stable appearance. There is no evidence of pulmonary edema, consolidation, pneumothorax, nodule or pleural fluid. The heart size is stable and within normal limits.  IMPRESSION: No active disease.   Electronically Signed   By: Irish Lack   On: 09/15/2013 19:21   Dg Chest Port 1 View  09/25/2013   CLINICAL DATA:  PICC placement  EXAM: PORTABLE CHEST - 1 VIEW  COMPARISON:  09/24/2013  FINDINGS: A left PICC extends to the superior vena cava but curls back on itself to have its tip in the left brachiocephalic vein.  Cardiac silhouette is normal in size and configuration.  Lungs are clear. No pleural effusion or pneumothorax.  Stable right anterior chest wall sequential pacemaker.   IMPRESSION: Left PICC tip curls in the left brachiocephalic vein.   Electronically Signed   By: Amie Portland M.D.   On: 09/25/2013 14:43   Dg Chest Port 1 View  09/24/2013   CLINICAL DATA:  History of congestion. History of atrial fibrillation. History of diabetes, hypertension, and coronary artery disease.  EXAM: PORTABLE CHEST - 1 VIEW  COMPARISON:  09/15/2013 study.  FINDINGS: Cardiac silhouette is at the upper range of normal size. Ectasia and nonaneurysmal calcification of the thoracic aorta are seen. Dual lead transvenous pacemaker is in place with generator on the right. No pneumothorax is evident. Mediastinal and hilar contours appear stable. There is slight upper lobe vessel prominence vascular congestion pattern accentuated by a semi-erect position. No consolidation or pleural effusion is evident. There is chronic elevation of the margin of the right hemidiaphragm.  IMPRESSION: Transvenous pacemaker in place.There is slight upper lobe vessel prominence vascular congestion pattern accentuated by a semi-erect position. No consolidation or pleural effusion is evident.   Electronically Signed   By: Onalee Hua  Call M.D.   On: 09/24/2013 17:36   Dg Chest Port 1v Same Day  09/25/2013   CLINICAL DATA:  PICC adjustment.  EXAM: PORTABLE CHEST - 1 VIEW SAME DAY  COMPARISON:  09/25/2013, 1418 hr.  FINDINGS: The left upper extremity PICC remains doubled over on itself in the left brachiocephalic vein. The tip terminates just lateral to the aortic arch. This is not in central position. Right subclavian is a pacemaker apparatus.  IMPRESSION: Left upper extremity PICC remains in the left brachiocephalic vein doubled back.   Electronically Signed   By: Andreas Newport M.D.   On: 09/25/2013 15:28    Lab Results: Basic Metabolic Panel:  Recent Labs  40/98/11 0617 09/28/13 0539  NA 133* 136  K 4.3 4.4  CL 95* 99  CO2 25 22  GLUCOSE 239* 251*  BUN 12 17  CREATININE 0.87 0.89  CALCIUM 8.7 8.7        CBC:  Recent Labs  09/26/13 0635  WBC 8.2  HGB 12.4  HCT 37.0  MCV 90.2  PLT 286    Recent Results (from the past 240 hour(s))  MRSA PCR SCREENING     Status: None   Collection Time    09/24/13  3:30 PM      Result Value Range Status   MRSA by PCR NEGATIVE  NEGATIVE Final   Comment:            The GeneXpert MRSA Assay (FDA  approved for NASAL specimens     only), is one component of a     comprehensive MRSA colonization     surveillance program. It is not     intended to diagnose MRSA     infection nor to guide or     monitor treatment for     MRSA infections.  URINE CULTURE     Status: None   Collection Time    09/24/13  4:30 PM      Result Value Range Status   Specimen Description URINE, CLEAN CATCH   Final   Special Requests NONE   Final   Culture  Setup Time     Final   Value: 09/24/2013 18:24     Performed at [facility] Count     Final   Value: >=100,000 COLONIES/ML     Performed at [facility]   Culture     Final   Value: ESCHERICHIA COLI     Performed at [facility]   Report Status 09/26/2013 FINAL   Final   Organism ID, Bacteria ESCHERICHIA COLI   Final  CLOSTRIDIUM DIFFICILE BY PCR     Status: None   Collection Time    09/27/13  9:33 AM      Result Value Range Status   C difficile by pcr NEGATIVE  NEGATIVE Final     Hospital Course: This 79 year old lady was admitted to the hospital with symptoms of poor responsiveness, poor by mouth intake and failure to thrive. Please see initial history as outlined below: HPI: DAISA [name] is a 79 y.o. female who is a resident of a facility, has dementia. Patient is a direct admission after being seen by Dr. Karilyn Cota today. History is limited due to mental status. The majority history is obtained from medical record, speaking to Dr. Karilyn Cota as well as staff at the facility. Patient has progressively been getting weak, lethargic and increasingly somnolent over the  past one to 2 weeks. It is unclear whether she's been having any fevers. She's not had any nausea, vomiting, diarrhea, cough or shortness of breath. The staff feels that she may be developing "a cold". Her by mouth intake has been very poor over the past one to 2 weeks. She is refusing to eat. She sleeps for most of the day. She also has been losing weight. His becoming increasingly confused. There is no mention of dysuria. Since patient was felt to be clinically dehydrated, she was to the hospital for further evaluation and treatment The patient was admitted and treated appropriately with intravenous fluids and when urinalysis was suggestive of UTI, she was started on intravenous antibiotics. Her sugars were monitored closely and sliding scale was used. She is actually improved with more alertness and she is taking in oral food and fluids. Urine culture showed Escherichia coli which was sensitive to Macrobid which she will be sent to the nursing home on orally. She was evaluated by physical therapy who felt that high level of care was more appropriate and therefore she will be sent to a skilled nursing facility today upon discharge. I think that the big picture is that her dementia is progressively getting worse and a conversation will need to be had with family members regarding further goals of care. Unfortunately this could not be done on this admission due to lack of availability of family members. Discharge Exam: Blood pressure 171/57, pulse 64, temperature 99 F (37.2 C), temperature source Oral, resp.  rate 20, height 5\' 6"  (1.676 m), weight 61.236 kg (135 lb), SpO2 100.00%. She looks chronically sick but is not acutely unwell. She is not toxic or septic clinically. Heart sounds are present and normal without murmurs. Lung fields are clear. Abdomen is soft and nontender. She is more alert than I've seen her in the last week or so. There are no obvious focal neurological signs.  Disposition: Skilled  nursing facility today.      Discharge Orders   Future Orders Complete By Expires   Diet - low sodium heart healthy  As directed    Increase activity slowly  As directed         SignedWilson Singer Pager [phone]  09/28/2013, 1:58 PM

## 2013-09-28 NOTE — Progress Notes (Signed)
Voided incontinently. Trying to get out of bed. Placed on bedside commode. Small amount of stool noted. Midline IV noted to be bleeding under dressing and from under dressing. Flushed midline but no blood return noted. Stopped IVF and will pass on the oncoming RN to let IV address issue with midline in am.

## 2013-09-28 NOTE — Care Management Note (Addendum)
    Page 1 of 1   09/28/2013     11:40:18 AM   CARE MANAGEMENT NOTE 09/28/2013  Patient:  [name], [name]   Account Number:  [id number]  Date Initiated:  09/28/2013  Documentation initiated by:  Rosemary Holms  Subjective/Objective Assessment:   Pt from [facility]. CSW coordinating placement in a SNF.     Action/Plan:   Anticipated DC Date:  09/28/2013   Anticipated DC Plan:  SKILLED NURSING FACILITY  In-house referral  Clinical Social Worker      DC Planning Services  CM consult      Choice offered to / List presented to:             Status of service:  Completed, signed off Medicare Important Message given?  YES (If response is "NO", the following Medicare IM given date fields will be blank) Date Medicare IM given:  09/28/2013 Date Additional Medicare IM given:    Discharge Disposition:  SKILLED NURSING FACILITY  Per UR Regulation:    If discussed at Long Length of Stay Meetings, dates discussed:    Comments:  09/28/13 Rosemary Holms RN BSN CM 11:30 Spoke with Mrs Amie Critchley, pt's sister. Reviewed IM. Signed copy with CM contact informaiton left at pt's bedside for pt's sister

## 2013-09-28 NOTE — Clinical Social Work Placement (Signed)
    Clinical Social Work Department CLINICAL SOCIAL WORK PLACEMENT NOTE 09/28/2013  Patient:  ALECHIA, LEZAMA  Account Number:  [id number] Admit date:  09/24/2013  Clinical Social Worker:  Santa Genera, CLINICAL SOCIAL WORKER  Date/time:  09/28/2013 11:00 AM  Clinical Social Work is seeking post-discharge placement for this patient at the following level of care:   SKILLED NURSING   (*CSW will update this form in Epic as items are completed)   09/28/2013  Patient/family provided with [facility] Department of Clinical Social Work's list of facilities offering this level of care within the geographic area requested by the patient (or if unable, by the patient's family).  09/28/2013  Patient/family informed of their freedom to choose among providers that offer the needed level of care, that participate in Medicare, Medicaid or managed care program needed by the patient, have an available bed and are willing to accept the patient.  09/28/2013  Patient/family informed of [facility]' ownership interest in [facility], as well as of the fact that they are under no obligation to receive care at this facility.  PASARR submitted to EDS on 09/28/2013 PASARR number received from EDS on 09/28/2013  FL2 transmitted to all facilities in geographic area requested by pt/family on  09/28/2013 FL2 transmitted to all facilities within larger geographic area on   Patient informed that his/her managed care company has contracts with or will negotiate with  certain facilities, including the following:     Patient/family informed of bed offers received:  09/28/2013 Patient chooses bed at [facility] [place] Physician recommends and patient chooses bed at    Patient to be transferred to  on   Patient to be transferred to facility by   The following physician request were entered in Epic:   Additional Comments:  Santa Genera, LCSW Clinical Social Worker [phone])

## 2013-09-28 NOTE — Progress Notes (Signed)
Report given to Nurse at [facility]. Transport here to take patient over. IV cath removed and intact. No pain/swelling at site.

## 2013-09-28 NOTE — Clinical Social Work Placement (Signed)
Clinical Social Work Department CLINICAL SOCIAL WORK PLACEMENT NOTE 09/28/2013  Patient:  JAYONNA, MEYERING  Account Number:  [id number] Admit date:  09/24/2013  Clinical Social Worker:  Santa Genera, CLINICAL SOCIAL WORKER  Date/time:  09/28/2013 11:00 AM  Clinical Social Work is seeking post-discharge placement for this patient at the following level of care:   SKILLED NURSING   (*CSW will update this form in Epic as items are completed)   09/28/2013  Patient/family provided with [facility] Department of Clinical Social Work's list of facilities offering this level of care within the geographic area requested by the patient (or if unable, by the patient's family).  09/28/2013  Patient/family informed of their freedom to choose among providers that offer the needed level of care, that participate in Medicare, Medicaid or managed care program needed by the patient, have an available bed and are willing to accept the patient.  09/28/2013  Patient/family informed of [facility]' ownership interest in [facility], as well as of the fact that they are under no obligation to receive care at this facility.  PASARR submitted to EDS on 09/28/2013 PASARR number received from EDS on 09/28/2013  FL2 transmitted to all facilities in geographic area requested by pt/family on  09/28/2013 FL2 transmitted to all facilities within larger geographic area on   Patient informed that his/her managed care company has contracts with or will negotiate with  certain facilities, including the following:     Patient/family informed of bed offers received:  09/28/2013 Patient chooses bed at [facility] [place] Physician recommends and patient chooses bed at  [facility] [place]  Patient to be transferred to [facility] [place] on  09/28/2013 Patient to be transferred to facility by [facility]  The following physician request were entered in Epic:   Additional Comments:  Derenda Fennel, LCSW [phone]

## 2013-09-28 NOTE — Clinical Social Work Note (Signed)
Pt d/c today to [facility]. Family and facility aware and agreeable. CSW called pt's sister to discuss transportation. She requests [facility]. CSW explained that insurance may not cover service as she is able to sit in chair. CSW offered wheelchair Zenaida Niece option and she declines. Sister refuses to consider other transport options and feels she requires EMS. D/C summary faxed.  Derenda Fennel, [place] [phone]

## 2013-09-30 ENCOUNTER — Encounter: Payer: Self-pay | Admitting: *Deleted

## 2013-11-11 ENCOUNTER — Encounter: Payer: Self-pay | Admitting: Internal Medicine

## 2013-11-11 ENCOUNTER — Encounter: Payer: Medicare Other | Admitting: Internal Medicine

## 2013-12-18 ENCOUNTER — Encounter: Payer: Self-pay | Admitting: *Deleted

## 2014-04-19 ENCOUNTER — Encounter: Payer: Self-pay | Admitting: Internal Medicine

## 2014-04-19 ENCOUNTER — Encounter: Payer: Medicare Other | Admitting: Internal Medicine

## 2014-06-21 ENCOUNTER — Encounter: Payer: Self-pay | Admitting: Internal Medicine

## 2014-06-21 ENCOUNTER — Ambulatory Visit (INDEPENDENT_AMBULATORY_CARE_PROVIDER_SITE_OTHER): Payer: Medicare (Managed Care) | Admitting: Internal Medicine

## 2014-06-21 VITALS — BP 114/61 | HR 76 | Ht 65.0 in

## 2014-06-21 DIAGNOSIS — I4891 Unspecified atrial fibrillation: Secondary | ICD-10-CM

## 2014-06-21 DIAGNOSIS — Z950 Presence of cardiac pacemaker: Secondary | ICD-10-CM | POA: Diagnosis not present

## 2014-06-21 DIAGNOSIS — I498 Other specified cardiac arrhythmias: Secondary | ICD-10-CM | POA: Diagnosis not present

## 2014-06-21 DIAGNOSIS — R001 Bradycardia, unspecified: Secondary | ICD-10-CM

## 2014-06-21 DIAGNOSIS — I482 Chronic atrial fibrillation, unspecified: Secondary | ICD-10-CM

## 2014-06-21 LAB — MDC_IDC_ENUM_SESS_TYPE_INCLINIC
Battery Impedance: 234 Ohm
Battery Remaining Longevity: 83 mo
Brady Statistic AP VP Percent: 30 %
Brady Statistic AS VP Percent: 69 %
Brady Statistic AS VS Percent: 1 %
Lead Channel Impedance Value: 496 Ohm
Lead Channel Impedance Value: 702 Ohm
Lead Channel Sensing Intrinsic Amplitude: 4 mV
Lead Channel Setting Pacing Amplitude: 2.5 V
Lead Channel Setting Pacing Amplitude: 5 V
Lead Channel Setting Pacing Pulse Width: 0.4 ms
MDC IDC MSMT BATTERY VOLTAGE: 2.78 V
MDC IDC MSMT LEADCHNL RV PACING THRESHOLD AMPLITUDE: 0.5 V
MDC IDC MSMT LEADCHNL RV PACING THRESHOLD PULSEWIDTH: 0.4 ms
MDC IDC SESS DTM: 20150706131923
MDC IDC SET LEADCHNL RV SENSING SENSITIVITY: 2 mV
MDC IDC STAT BRADY AP VS PERCENT: 0 %

## 2014-06-21 NOTE — Progress Notes (Signed)
HPI  [name] returns today for followup. She is a very pleasant 80 year old woman with a history of atrial fibrillation and complete heart block. She has hypertension. She is status post permanent pacemaker insertion. She denies chest pain or shortness of breath. No syncope. She has minimal peripheral edema. She is not a candidate for coumadin. No Known Allergies   No Known Allergies   Current Outpatient Prescriptions  Medication Sig Dispense Refill  . acetaminophen (TYLENOL) 500 MG tablet Take 500-1,000 mg by mouth every 4 (four) hours as needed. For pain/fever       . allopurinol (ZYLOPRIM) 100 MG tablet Take 100 mg by mouth daily.       Marland Kitchen. amLODipine (NORVASC) 10 MG tablet Take 10 mg by mouth daily.       Marland Kitchen. aspirin EC 81 MG tablet Take 81 mg by mouth daily.        . B Complex-C (B-COMPLEX WITH VITAMIN C) tablet Take 1 tablet by mouth daily.        . benazepril (LOTENSIN) 10 MG tablet Take 10 mg by mouth daily.      Marland Kitchen. donepezil (ARICEPT) 10 MG tablet Take 10 mg by mouth at bedtime.      Marland Kitchen. doxycycline (VIBRA-TABS) 100 MG tablet 2 (two) times daily.      . ergocalciferol (VITAMIN D2) 50000 UNITS capsule Take 50,000 Units by mouth every Thursday. Take one capsule on Thursdays      . escitalopram (LEXAPRO) 5 MG tablet daily.      . haloperidol (HALDOL) 1 MG tablet Take 1 tablet (1 mg total) by mouth 2 (two) times daily.  60 tablet  0  . insulin aspart (NOVOLOG) 100 UNIT/ML injection Inject 5 Units into the skin 2 (two) times daily. Gets at 0730 before breakfast and at 1530 before supper.      . levothyroxine (SYNTHROID, LEVOTHROID) 50 MCG tablet Take 50 mcg by mouth daily.       . memantine (NAMENDA) 5 MG tablet Take 5 mg by mouth 2 (two) times daily.      . metoprolol tartrate (LOPRESSOR) 25 MG tablet Take 25 mg by mouth 2 (two) times daily.      . naphazoline-pheniramine (NAPHCON-A) 0.025-0.3 % ophthalmic solution Place 1 drop into both eyes 3 (three) times daily as needed. For  itching      . omeprazole (PRILOSEC) 20 MG capsule Take 20 mg by mouth daily before breakfast.        . ondansetron (ZOFRAN) 8 MG tablet Take 8 mg by mouth every 6 (six) hours as needed. For nausea      . Polyethylene Glycol 3350 GRAN Take 17 g by mouth every Monday, Wednesday, and Friday. Patient takes on Mon.,Wed.,Fri.      . pravastatin (PRAVACHOL) 20 MG tablet Take 20 mg by mouth every evening.      . vitamin B-12 (CYANOCOBALAMIN) 1000 MCG tablet Take 2,000 mcg by mouth daily.         No current facility-administered medications for this visit.     Past Medical History  Diagnosis Date  . Diabetes mellitus     insulin dependant  . Hypertension   . Hyperlipidemia   . Paroxysmal atrial fibrillation   . GERD (gastroesophageal reflux disease)   . Dementia   . Cardiac pacemaker 2003, revised 2012    implant  . Hypothyroidism   . Renal insufficiency   . Asthma   . Gastroparesis 06/2011  abnormal GES, 80% retention at two hours  . Coronary artery disease   . Dementia   . Myocardial infarction     ROS:   All systems reviewed and negative except as noted in the HPI.   Past Surgical History  Procedure Laterality Date  . Pacemaker insertion    . Appendectomy    . Hernia repair  2007    incarcerated  . Hip fracture surgery  2003    left  . Esophagogastroduodenoscopy  07/09/2011    Procedure: ESOPHAGOGASTRODUODENOSCOPY (EGD);  Surgeon: Claudia HarmanSandi M Fields, MD;  Location: [facility];  Service: Endoscopy;  Laterality: N/A;.  Mild gastritis, schatzki ring, small hh     Family History  Problem Relation Age of Onset  . Diabetes Father   . Colon cancer Neg Hx      History   Social History  . Marital Status: Widowed    Spouse Name: N/A    Number of Children: N/A  . Years of Education: N/A   Occupational History  . retired     living [place] assisted living, [place]  .      retired [employer]   Social History Main Topics  . Smoking status: Former Games developermoker  .  Smokeless tobacco: Never Used     Comment: Quit 20 plus years  . Alcohol Use: No  . Drug Use: No  . Sexual Activity: No   Other Topics Concern  . Not on file   Social History Narrative   Sister, Radio broadcast assistantClocclton [name].     BP 114/61  Pulse 76  Ht 5\' 5"  (1.651 m)  Physical Exam:  Chronically ill appearing elderly woman, NAD HEENT: Unremarkable Neck:  No JVD, no thyromegally Back:  No CVA tenderness Lungs:  Clear with no wheezes HEART:  Regular rate rhythm, no murmurs, no rubs, no clicks Abd:  soft, positive bowel sounds, no organomegally, no rebound, no guarding Ext:  2 plus pulses, no edema, no cyanosis, no clubbing Skin:  No rashes no nodules Neuro:  CN II through XII intact, motor grossly intact   DEVICE  Normal device function.  See PaceArt for details.   Assess/Plan:

## 2014-06-21 NOTE — Patient Instructions (Signed)
Your physician wants you to follow-up in: 6 months in the device clinic and 1 year with Dr. Taylor. You will receive a reminder letter in the mail two months in advance. If you don't receive a letter, please call our office to schedule the follow-up appointment.  Your physician recommends that you continue on your current medications as directed. Please refer to the Current Medication list given to you today.\     

## 2014-06-21 NOTE — Assessment & Plan Note (Signed)
Her Medtronic DDD PM is working normally. Will recheck in several months. 

## 2014-06-21 NOTE — Assessment & Plan Note (Signed)
Her rate is controlled. No change in meds. She is not an anti-coagulation candidate.

## 2014-06-28 ENCOUNTER — Encounter: Payer: Self-pay | Admitting: Internal Medicine

## 2014-10-18 ENCOUNTER — Telehealth: Payer: Self-pay | Admitting: Cardiology

## 2014-10-18 NOTE — Telephone Encounter (Signed)
The director of [name] called and stated pt no longer was at the residence and that she had been moved to [facility].

## 2015-01-06 ENCOUNTER — Encounter (HOSPITAL_COMMUNITY): Payer: Self-pay | Admitting: Emergency Medicine

## 2015-01-06 ENCOUNTER — Inpatient Hospital Stay (HOSPITAL_COMMUNITY)
Admission: EM | Admit: 2015-01-06 | Discharge: 2015-01-08 | DRG: 637 | Disposition: A | Discharge status: Skilled Nursing Facility | Payer: Medicare Other | Attending: Internal Medicine | Admitting: Internal Medicine

## 2015-01-06 ENCOUNTER — Inpatient Hospital Stay (HOSPITAL_COMMUNITY): Payer: Medicare Other

## 2015-01-06 DIAGNOSIS — E43 Unspecified severe protein-calorie malnutrition: Secondary | ICD-10-CM | POA: Diagnosis not present

## 2015-01-06 DIAGNOSIS — I10 Essential (primary) hypertension: Secondary | ICD-10-CM | POA: Diagnosis present

## 2015-01-06 DIAGNOSIS — J45909 Unspecified asthma, uncomplicated: Secondary | ICD-10-CM | POA: Diagnosis present

## 2015-01-06 DIAGNOSIS — T383X5A Adverse effect of insulin and oral hypoglycemic [antidiabetic] drugs, initial encounter: Secondary | ICD-10-CM | POA: Diagnosis present

## 2015-01-06 DIAGNOSIS — Z87891 Personal history of nicotine dependence: Secondary | ICD-10-CM | POA: Diagnosis not present

## 2015-01-06 DIAGNOSIS — E785 Hyperlipidemia, unspecified: Secondary | ICD-10-CM | POA: Diagnosis present

## 2015-01-06 DIAGNOSIS — Z950 Presence of cardiac pacemaker: Secondary | ICD-10-CM | POA: Diagnosis not present

## 2015-01-06 DIAGNOSIS — Z681 Body mass index (BMI) 19 or less, adult: Secondary | ICD-10-CM | POA: Diagnosis not present

## 2015-01-06 DIAGNOSIS — Z7982 Long term (current) use of aspirin: Secondary | ICD-10-CM

## 2015-01-06 DIAGNOSIS — T68XXXA Hypothermia, initial encounter: Secondary | ICD-10-CM | POA: Diagnosis not present

## 2015-01-06 DIAGNOSIS — E871 Hypo-osmolality and hyponatremia: Secondary | ICD-10-CM | POA: Diagnosis present

## 2015-01-06 DIAGNOSIS — I2510 Atherosclerotic heart disease of native coronary artery without angina pectoris: Secondary | ICD-10-CM | POA: Diagnosis present

## 2015-01-06 DIAGNOSIS — K219 Gastro-esophageal reflux disease without esophagitis: Secondary | ICD-10-CM | POA: Diagnosis present

## 2015-01-06 DIAGNOSIS — E162 Hypoglycemia, unspecified: Secondary | ICD-10-CM | POA: Diagnosis present

## 2015-01-06 DIAGNOSIS — Z833 Family history of diabetes mellitus: Secondary | ICD-10-CM

## 2015-01-06 DIAGNOSIS — Z794 Long term (current) use of insulin: Secondary | ICD-10-CM

## 2015-01-06 DIAGNOSIS — R627 Adult failure to thrive: Secondary | ICD-10-CM | POA: Diagnosis present

## 2015-01-06 DIAGNOSIS — F0390 Unspecified dementia without behavioral disturbance: Secondary | ICD-10-CM | POA: Diagnosis not present

## 2015-01-06 DIAGNOSIS — I480 Paroxysmal atrial fibrillation: Secondary | ICD-10-CM | POA: Diagnosis not present

## 2015-01-06 DIAGNOSIS — Z66 Do not resuscitate: Secondary | ICD-10-CM | POA: Diagnosis present

## 2015-01-06 DIAGNOSIS — E160 Drug-induced hypoglycemia without coma: Secondary | ICD-10-CM | POA: Diagnosis not present

## 2015-01-06 DIAGNOSIS — E860 Dehydration: Secondary | ICD-10-CM | POA: Diagnosis present

## 2015-01-06 DIAGNOSIS — E11649 Type 2 diabetes mellitus with hypoglycemia without coma: Secondary | ICD-10-CM | POA: Diagnosis present

## 2015-01-06 DIAGNOSIS — I252 Old myocardial infarction: Secondary | ICD-10-CM

## 2015-01-06 DIAGNOSIS — E039 Hypothyroidism, unspecified: Secondary | ICD-10-CM | POA: Diagnosis present

## 2015-01-06 DIAGNOSIS — I4891 Unspecified atrial fibrillation: Secondary | ICD-10-CM | POA: Diagnosis present

## 2015-01-06 LAB — URINE MICROSCOPIC-ADD ON

## 2015-01-06 LAB — GLUCOSE, CAPILLARY
GLUCOSE-CAPILLARY: 134 mg/dL — AB (ref 70–99)
GLUCOSE-CAPILLARY: 157 mg/dL — AB (ref 70–99)
GLUCOSE-CAPILLARY: 168 mg/dL — AB (ref 70–99)
GLUCOSE-CAPILLARY: 190 mg/dL — AB (ref 70–99)
GLUCOSE-CAPILLARY: 196 mg/dL — AB (ref 70–99)
GLUCOSE-CAPILLARY: 281 mg/dL — AB (ref 70–99)
GLUCOSE-CAPILLARY: 37 mg/dL — AB (ref 70–99)
Glucose-Capillary: 216 mg/dL — ABNORMAL HIGH (ref 70–99)
Glucose-Capillary: 103 mg/dL — ABNORMAL HIGH (ref 70–99)

## 2015-01-06 LAB — CBC WITH DIFFERENTIAL/PLATELET
Basophils Absolute: 0 10*3/uL (ref 0.0–0.1)
Basophils Relative: 0 % (ref 0–1)
Eosinophils Absolute: 0 10*3/uL (ref 0.0–0.7)
Eosinophils Relative: 0 % (ref 0–5)
HEMATOCRIT: 31.1 % — AB (ref 36.0–46.0)
HEMOGLOBIN: 10.2 g/dL — AB (ref 12.0–15.0)
LYMPHS ABS: 1 10*3/uL (ref 0.7–4.0)
Lymphocytes Relative: 21 % (ref 12–46)
MCH: 30.2 pg (ref 26.0–34.0)
MCHC: 32.8 g/dL (ref 30.0–36.0)
MCV: 92 fL (ref 78.0–100.0)
Monocytes Absolute: 0.3 10*3/uL (ref 0.1–1.0)
Monocytes Relative: 7 % (ref 3–12)
Neutro Abs: 3.5 10*3/uL (ref 1.7–7.7)
Neutrophils Relative %: 72 % (ref 43–77)
Platelets: 208 10*3/uL (ref 150–400)
RBC: 3.38 MIL/uL — ABNORMAL LOW (ref 3.87–5.11)
RDW: 13.6 % (ref 11.5–15.5)
WBC: 4.8 10*3/uL (ref 4.0–10.5)

## 2015-01-06 LAB — URINALYSIS, ROUTINE W REFLEX MICROSCOPIC
BILIRUBIN URINE: NEGATIVE
Glucose, UA: 100 mg/dL — AB
HGB URINE DIPSTICK: NEGATIVE
Ketones, ur: NEGATIVE mg/dL
Nitrite: NEGATIVE
PROTEIN: NEGATIVE mg/dL
SPECIFIC GRAVITY, URINE: 1.015 (ref 1.005–1.030)
UROBILINOGEN UA: 0.2 mg/dL (ref 0.0–1.0)
pH: 6 (ref 5.0–8.0)

## 2015-01-06 LAB — COMPREHENSIVE METABOLIC PANEL
ALBUMIN: 2.4 g/dL — AB (ref 3.5–5.2)
ALK PHOS: 76 U/L (ref 39–117)
ALT: 17 U/L (ref 0–35)
ANION GAP: 6 (ref 5–15)
AST: 56 U/L — AB (ref 0–37)
BILIRUBIN TOTAL: 0.4 mg/dL (ref 0.3–1.2)
BUN: 19 mg/dL (ref 6–23)
CALCIUM: 7.5 mg/dL — AB (ref 8.4–10.5)
CHLORIDE: 96 meq/L (ref 96–112)
CO2: 24 mmol/L (ref 19–32)
CREATININE: 0.87 mg/dL (ref 0.50–1.10)
GFR calc Af Amer: 67 mL/min — ABNORMAL LOW (ref >90)
GFR calc non Af Amer: 58 mL/min — ABNORMAL LOW (ref >90)
GLUCOSE: 325 mg/dL — AB (ref 70–99)
POTASSIUM: 4.2 mmol/L (ref 3.5–5.1)
SODIUM: 126 mmol/L — AB (ref 135–145)
Total Protein: 5.2 g/dL — ABNORMAL LOW (ref 6.0–8.3)

## 2015-01-06 LAB — CORTISOL: Cortisol, Plasma: 19 ug/dL

## 2015-01-06 LAB — TSH: TSH: 2.429 u[IU]/mL (ref 0.350–4.500)

## 2015-01-06 LAB — LACTIC ACID, PLASMA
Lactic Acid, Venous: 3.2 mmol/L (ref 0.5–2.0)
Lactic Acid, Venous: 2.5 mmol/L (ref 0.5–2.0)

## 2015-01-06 LAB — SODIUM: SODIUM: 129 mmol/L — AB (ref 135–145)

## 2015-01-06 LAB — OSMOLALITY: Osmolality: 273 mOsm/kg — ABNORMAL LOW (ref 275–300)

## 2015-01-06 MED ORDER — METOPROLOL TARTRATE 25 MG PO TABS
25.0000 mg | ORAL_TABLET | Freq: Two times a day (BID) | ORAL | Status: DC
  Administered 2015-01-06 – 2015-01-08 (×5): 25 mg via ORAL
  Filled 2015-01-06 (×6): qty 1

## 2015-01-06 MED ORDER — DEXTROSE 50 % IV SOLN
INTRAVENOUS | Status: AC
  Administered 2015-01-06: 04:00:00
  Filled 2015-01-06: qty 50

## 2015-01-06 MED ORDER — MEMANTINE HCL 10 MG PO TABS
5.0000 mg | ORAL_TABLET | Freq: Two times a day (BID) | ORAL | Status: DC
  Administered 2015-01-06 – 2015-01-08 (×5): 5 mg via ORAL
  Filled 2015-01-06 (×5): qty 1

## 2015-01-06 MED ORDER — RISAQUAD PO CAPS
1.0000 | ORAL_CAPSULE | Freq: Every day | ORAL | Status: DC
  Administered 2015-01-06 – 2015-01-08 (×3): 1 via ORAL
  Filled 2015-01-06 (×3): qty 1

## 2015-01-06 MED ORDER — ENSURE COMPLETE PO LIQD
237.0000 mL | Freq: Two times a day (BID) | ORAL | Status: DC
  Administered 2015-01-06 – 2015-01-08 (×5): 237 mL via ORAL

## 2015-01-06 MED ORDER — INSULIN ASPART 100 UNIT/ML ~~LOC~~ SOLN
0.0000 [IU] | SUBCUTANEOUS | Status: DC
  Administered 2015-01-06 (×2): 3 [IU] via SUBCUTANEOUS
  Administered 2015-01-07 – 2015-01-08 (×2): 2 [IU] via SUBCUTANEOUS

## 2015-01-06 MED ORDER — ALLOPURINOL 100 MG PO TABS
100.0000 mg | ORAL_TABLET | Freq: Every day | ORAL | Status: DC
  Administered 2015-01-06 – 2015-01-08 (×3): 100 mg via ORAL
  Filled 2015-01-06 (×3): qty 1

## 2015-01-06 MED ORDER — SODIUM CHLORIDE 0.9 % IV BOLUS (SEPSIS)
1000.0000 mL | Freq: Once | INTRAVENOUS | Status: AC
  Administered 2015-01-06: 1000 mL via INTRAVENOUS

## 2015-01-06 MED ORDER — SODIUM CHLORIDE 0.9 % IV SOLN
INTRAVENOUS | Status: DC
  Administered 2015-01-06 – 2015-01-08 (×5): via INTRAVENOUS

## 2015-01-06 MED ORDER — BENAZEPRIL HCL 10 MG PO TABS
10.0000 mg | ORAL_TABLET | Freq: Every day | ORAL | Status: DC
  Administered 2015-01-06 – 2015-01-08 (×3): 10 mg via ORAL
  Filled 2015-01-06 (×3): qty 1

## 2015-01-06 MED ORDER — ACETAMINOPHEN 650 MG RE SUPP: 650.0000 mg | Freq: Four times a day (QID) | RECTAL | Status: DC | PRN

## 2015-01-06 MED ORDER — ASPIRIN EC 81 MG PO TBEC
81.0000 mg | DELAYED_RELEASE_TABLET | Freq: Every day | ORAL | Status: DC
  Administered 2015-01-06 – 2015-01-08 (×3): 81 mg via ORAL
  Filled 2015-01-06 (×3): qty 1

## 2015-01-06 MED ORDER — PRO-STAT SUGAR FREE PO LIQD
30.0000 mL | Freq: Two times a day (BID) | ORAL | Status: DC
  Administered 2015-01-06 – 2015-01-08 (×5): 30 mL via ORAL
  Filled 2015-01-06 (×4): qty 30

## 2015-01-06 MED ORDER — AMLODIPINE BESYLATE 5 MG PO TABS
10.0000 mg | ORAL_TABLET | Freq: Every day | ORAL | Status: DC
  Administered 2015-01-06 – 2015-01-08 (×3): 10 mg via ORAL
  Filled 2015-01-06 (×3): qty 2

## 2015-01-06 MED ORDER — NAPHAZOLINE-PHENIRAMINE 0.025-0.3 % OP SOLN
1.0000 [drp] | Freq: Three times a day (TID) | OPHTHALMIC | Status: DC | PRN
  Filled 2015-01-06: qty 15

## 2015-01-06 MED ORDER — ONDANSETRON HCL 4 MG PO TABS: 8.0000 mg | ORAL_TABLET | Freq: Four times a day (QID) | ORAL | Status: DC | PRN

## 2015-01-06 MED ORDER — METFORMIN HCL 500 MG PO TABS
500.0000 mg | ORAL_TABLET | Freq: Two times a day (BID) | ORAL | Status: DC
  Administered 2015-01-06 – 2015-01-07 (×2): 500 mg via ORAL
  Filled 2015-01-06 (×2): qty 1

## 2015-01-06 MED ORDER — ONDANSETRON HCL 4 MG PO TABS: 4.0000 mg | ORAL_TABLET | Freq: Four times a day (QID) | ORAL | Status: DC | PRN

## 2015-01-06 MED ORDER — VITAMIN B-12 1000 MCG PO TABS
2000.0000 ug | ORAL_TABLET | Freq: Every day | ORAL | Status: DC
  Administered 2015-01-06 – 2015-01-08 (×3): 2000 ug via ORAL
  Filled 2015-01-06 (×3): qty 2

## 2015-01-06 MED ORDER — HALOPERIDOL 2 MG PO TABS
1.0000 mg | ORAL_TABLET | Freq: Two times a day (BID) | ORAL | Status: DC
  Administered 2015-01-06 – 2015-01-08 (×5): 1 mg via ORAL
  Filled 2015-01-06 (×5): qty 1

## 2015-01-06 MED ORDER — SODIUM CHLORIDE 0.9 % IJ SOLN: 3.0000 mL | Freq: Two times a day (BID) | INTRAMUSCULAR | Status: DC

## 2015-01-06 MED ORDER — LEVOTHYROXINE SODIUM 50 MCG PO TABS
50.0000 ug | ORAL_TABLET | Freq: Every day | ORAL | Status: DC
  Administered 2015-01-06 – 2015-01-08 (×3): 50 ug via ORAL
  Filled 2015-01-06 (×3): qty 1

## 2015-01-06 MED ORDER — DEXTROSE-NACL 5-0.9 % IV SOLN
INTRAVENOUS | Status: DC
  Administered 2015-01-06: 04:00:00 via INTRAVENOUS

## 2015-01-06 MED ORDER — ACETAMINOPHEN 325 MG PO TABS: 650.0000 mg | ORAL_TABLET | Freq: Four times a day (QID) | ORAL | Status: DC | PRN

## 2015-01-06 MED ORDER — HEPARIN SODIUM (PORCINE) 5000 UNIT/ML IJ SOLN
5000.0000 [IU] | Freq: Three times a day (TID) | INTRAMUSCULAR | Status: DC
  Administered 2015-01-06 – 2015-01-08 (×6): 5000 [IU] via SUBCUTANEOUS
  Filled 2015-01-06 (×7): qty 1

## 2015-01-06 MED ORDER — ONDANSETRON HCL 4 MG/2ML IJ SOLN: 4.0000 mg | Freq: Four times a day (QID) | INTRAMUSCULAR | Status: DC | PRN

## 2015-01-06 NOTE — ED Notes (Signed)
CBG 168. EDP notified

## 2015-01-06 NOTE — ED Notes (Signed)
Pt. From [facility]. EMS reports CBG of 29 on scene. Pt. Given IM glucagon, pt. CBG of 59. CBG 37 on arrival.

## 2015-01-06 NOTE — ED Provider Notes (Addendum)
CSN: [id number]     Arrival date & time 01/06/15  0303 History   First MD Initiated Contact with Patient 01/06/15 [phone]     Chief Complaint  Patient presents with  . Hypoglycemia   Level V caveat for dementia  (Consider location/radiation/quality/duration/timing/severity/associated sxs/prior Treatment) HPI  Patient presents from a local nursing home. Patient has diabetes. She has a caregiver with her who reports the patient has lost a lot of weight and is not eating or drinking. She presents with a MOST form documenting the family does not want feeding tube placed despite conversation with nursing staff they report tonight on the regular rounds patient was noted to be unresponsive and sweaty and clammy. When they checked her CBG was very low. This was around 11:30 PM. They gave her some sugar and cranberry juice with sugar in it however her CBG would not rise. EMS was called and was unable to get IV access. Her CBG at that point was 29. They gave the patient glucagon IM and her CBG improved to 59. However it did drop again to 37 as she presents to the emergency department.  PCP Dr Selena Batten  Past Medical History  Diagnosis Date  . Diabetes mellitus     insulin dependant  . Hypertension   . Hyperlipidemia   . Paroxysmal atrial fibrillation   . GERD (gastroesophageal reflux disease)   . Dementia   . Cardiac pacemaker 2003, revised 2012    implant  . Hypothyroidism   . Renal insufficiency   . Asthma   . Gastroparesis 06/2011    abnormal GES, 80% retention at two hours  . Coronary artery disease   . Dementia   . Myocardial infarction    Past Surgical History  Procedure Laterality Date  . Pacemaker insertion    . Appendectomy    . Hernia repair  2007    incarcerated  . Hip fracture surgery  2003    left  . Esophagogastroduodenoscopy  07/09/2011    Procedure: ESOPHAGOGASTRODUODENOSCOPY (EGD);  Surgeon: Arlyce Harman, MD;  Location: [facility];  Service: Endoscopy;  Laterality: N/A;.   Mild gastritis, schatzki ring, small hh   Family History  Problem Relation Age of Onset  . Diabetes Father   . Colon cancer Neg Hx    History  Substance Use Topics  . Smoking status: Former Games developer  . Smokeless tobacco: Never Used     Comment: Quit 20 plus years  . Alcohol Use: No   Lives in NH Nonambulatory per their records  OB History    No data available     Review of Systems  Unable to perform ROS: Dementia      Allergies  Review of patient's allergies indicates no known allergies.  Home Medications   Prior to Admission medications   Medication Sig Start Date End Date Taking? Authorizing Provider  acetaminophen (TYLENOL) 500 MG tablet Take 500-1,000 mg by mouth every 4 (four) hours as needed. For pain/fever     Historical Provider, MD  allopurinol (ZYLOPRIM) 100 MG tablet Take 100 mg by mouth daily.     Historical Provider, MD  amLODipine (NORVASC) 10 MG tablet Take 10 mg by mouth daily.     Historical Provider, MD  aspirin EC 81 MG tablet Take 81 mg by mouth daily.      Historical Provider, MD  B Complex-C (B-COMPLEX WITH VITAMIN C) tablet Take 1 tablet by mouth daily.      Historical Provider, MD  benazepril (LOTENSIN)  10 MG tablet Take 10 mg by mouth daily.    Historical Provider, MD  donepezil (ARICEPT) 10 MG tablet Take 10 mg by mouth at bedtime.    Historical Provider, MD  doxycycline (VIBRA-TABS) 100 MG tablet 2 (two) times daily. 06/14/14   Historical Provider, MD  ergocalciferol (VITAMIN D2) 50000 UNITS capsule Take 50,000 Units by mouth every Thursday. Take one capsule on Thursdays    Historical Provider, MD  escitalopram (LEXAPRO) 5 MG tablet daily. 06/17/14   Historical Provider, MD  haloperidol (HALDOL) 1 MG tablet Take 1 tablet (1 mg total) by mouth 2 (two) times daily. 09/28/13   Nimish Normajean Glasgow, MD  insulin aspart (NOVOLOG) 100 UNIT/ML injection Inject 5 Units into the skin 2 (two) times daily. Gets at 0730 before breakfast and at 1530 before supper.     Historical Provider, MD  levothyroxine (SYNTHROID, LEVOTHROID) 50 MCG tablet Take 50 mcg by mouth daily.     Historical Provider, MD  memantine (NAMENDA) 5 MG tablet Take 5 mg by mouth 2 (two) times daily.    Historical Provider, MD  metoprolol tartrate (LOPRESSOR) 25 MG tablet Take 25 mg by mouth 2 (two) times daily.    Historical Provider, MD  naphazoline-pheniramine (NAPHCON-A) 0.025-0.3 % ophthalmic solution Place 1 drop into both eyes 3 (three) times daily as needed. For itching    Historical Provider, MD  omeprazole (PRILOSEC) 20 MG capsule Take 20 mg by mouth daily before breakfast.   06/27/11   Tiffany Kocher, PA-C  ondansetron (ZOFRAN) 8 MG tablet Take 8 mg by mouth every 6 (six) hours as needed. For nausea    Historical Provider, MD  Polyethylene Glycol 3350 GRAN Take 17 g by mouth every Monday, Wednesday, and Friday. Patient takes on Mon.,Wed.,Fri. 08/27/11   Tiffany Kocher, PA-C  pravastatin (PRAVACHOL) 20 MG tablet Take 20 mg by mouth every evening.    Historical Provider, MD  vitamin B-12 (CYANOCOBALAMIN) 1000 MCG tablet Take 2,000 mcg by mouth daily.      Historical Provider, MD   BP 105/85 mmHg  Pulse 73  Resp 17  SpO2 100%  Vital signs normal   Physical Exam  Constitutional:  Non-toxic appearance. She does not appear ill. No distress.  Frail elderly female, staff member from [facility] states she weighed 83 pounds last week, last weight in our computer is 135 pounds  HENT:  Head: Normocephalic and atraumatic.  Right Ear: External ear normal.  Left Ear: External ear normal.  Nose: Nose normal. No mucosal edema or rhinorrhea.  Mouth/Throat: Oropharynx is clear and moist and mucous membranes are normal. No dental abscesses or uvula swelling.  Eyes: Conjunctivae and EOM are normal. Pupils are equal, round, and reactive to light.  Neck: Normal range of motion and full passive range of motion without pain. Neck supple.  Cardiovascular: Normal rate, regular rhythm and normal heart  sounds.  Exam reveals no gallop and no friction rub.   No murmur heard. Pulmonary/Chest: Effort normal and breath sounds normal. No respiratory distress. She has no wheezes. She has no rhonchi. She has no rales. She exhibits no tenderness and no crepitus.  pacemaker  Abdominal: Soft. Normal appearance and bowel sounds are normal. She exhibits no distension. There is no tenderness. There is no rebound and no guarding.  Musculoskeletal: Normal range of motion. She exhibits no edema or tenderness.  Moves all extremities well.   Neurological: She is alert. She has normal strength. No cranial nerve deficit.  Skin: Skin  is warm, dry and intact. No rash noted. No erythema. No pallor.  Psychiatric: She has a normal mood and affect. Her speech is normal and behavior is normal. Her mood appears not anxious.  Nursing note and vitals reviewed.   ED Course  Procedures (including critical care time)  Medications  dextrose 5 %-0.9 % sodium chloride infusion ( Intravenous Stopped 01/06/15 0530)  dextrose 50 % solution (  Given 01/06/15 0330)  sodium chloride 0.9 % bolus 1,000 mL (0 mLs Intravenous Stopped 01/06/15 0616)   Patient has a signed most form. She is to not have CPR. They specifically stated no feeding tube but IV fluids as needed. She could also have antibiotics if needed. They do not want intubation but would allow BiPAP if necessary. They also want her to be kept out of the ICU.  Review of her nursing home reports shows her CBG at 6 AM this morning was 92 and at 6 PM this evening it was 110.  03:43 CBG done after D50 1 amp was 168. Patient's rectal temp was 93.3. She was started on a Bair warming blanket  04:30 CBG 190  05:10 CMET returned with glucose 325, D5NS infusion stopped  Family member at bedside, informed of her progress.   05:30 nurses report pt had a very loose stool with bloody mucus mixed in it, but tested hemoccult neg.  06:15 Pt's rectal temp is now 95 degrees and her CBG is  281  06:33 Dr Alvester Morin, admit to [facility], he wants septic workup to include CXR, blood cultures and lactic acid   Labs Review Results for orders placed or performed during the hospital encounter of 01/06/15  Urinalysis, Routine w reflex microscopic  Result Value Ref Range   Color, Urine YELLOW YELLOW   APPearance CLEAR CLEAR   Specific Gravity, Urine 1.015 1.005 - 1.030   pH 6.0 5.0 - 8.0   Glucose, UA 100 (A) NEGATIVE mg/dL   Hgb urine dipstick NEGATIVE NEGATIVE   Bilirubin Urine NEGATIVE NEGATIVE   Ketones, ur NEGATIVE NEGATIVE mg/dL   Protein, ur NEGATIVE NEGATIVE mg/dL   Urobilinogen, UA 0.2 0.0 - 1.0 mg/dL   Nitrite NEGATIVE NEGATIVE   Leukocytes, UA SMALL (A) NEGATIVE  CBC with Differential  Result Value Ref Range   WBC 4.8 4.0 - 10.5 K/uL   RBC 3.38 (L) 3.87 - 5.11 MIL/uL   Hemoglobin 10.2 (L) 12.0 - 15.0 g/dL   HCT 16.1 (L) 09.6 - 04.5 %   MCV 92.0 78.0 - 100.0 fL   MCH 30.2 26.0 - 34.0 pg   MCHC 32.8 30.0 - 36.0 g/dL   RDW 40.9 81.1 - 91.4 %   Platelets 208 150 - 400 K/uL   Neutrophils Relative % 72 43 - 77 %   Neutro Abs 3.5 1.7 - 7.7 K/uL   Lymphocytes Relative 21 12 - 46 %   Lymphs Abs 1.0 0.7 - 4.0 K/uL   Monocytes Relative 7 3 - 12 %   Monocytes Absolute 0.3 0.1 - 1.0 K/uL   Eosinophils Relative 0 0 - 5 %   Eosinophils Absolute 0.0 0.0 - 0.7 K/uL   Basophils Relative 0 0 - 1 %   Basophils Absolute 0.0 0.0 - 0.1 K/uL  Comprehensive metabolic panel  Result Value Ref Range   Sodium 126 (L) 135 - 145 mmol/L   Potassium 4.2 3.5 - 5.1 mmol/L   Chloride 96 96 - 112 mEq/L   CO2 24 19 - 32 mmol/L   Glucose,  Bld 325 (H) 70 - 99 mg/dL   BUN 19 6 - 23 mg/dL   Creatinine, Ser 7.840.87 0.50 - 1.10 mg/dL   Calcium 7.5 (L) 8.4 - 10.5 mg/dL   Total Protein 5.2 (L) 6.0 - 8.3 g/dL   Albumin 2.4 (L) 3.5 - 5.2 g/dL   AST 56 (H) 0 - 37 U/L   ALT 17 0 - 35 U/L   Alkaline Phosphatase 76 39 - 117 U/L   Total Bilirubin 0.4 0.3 - 1.2 mg/dL   GFR calc non Af Amer 58 (L) >90  mL/min   GFR calc Af Amer 67 (L) >90 mL/min   Anion gap 6 5 - 15  Urine microscopic-add on  Result Value Ref Range   Squamous Epithelial / LPF FEW (A) RARE   WBC, UA 0-2 <3 WBC/hpf   Bacteria, UA MANY (A) RARE   Laboratory interpretation all normal except hyperglycemia, malnutrition     Imaging Review No results found.   EKG Interpretation None        Date: 01/06/2015  Rate: 63  Rhythm: indeterminate  QRS Axis: left  Intervals: normal  ST/T Wave abnormalities: nonspecific ST/T changes  Conduction Disutrbances:none  Narrative Interpretation: electrical artifact, baseline wander  Old EKG Reviewed: no obvious difference    MDM   Final diagnoses:  Hypoglycemia  Hypothermia, initial encounter  Dementia, without behavioral disturbance    Plan admission   CRITICAL CARE Performed by: Katiria Calame L Total critical care time: 38 min Critical care time was exclusive of separately billable procedures and treating other patients. Critical care was necessary to treat or prevent imminent or life-threatening deterioration. Critical care was time spent personally by me on the following activities: development of treatment plan with patient and/or surrogate as well as nursing, discussions with consultants, evaluation of patient's response to treatment, examination of patient, obtaining history from patient or surrogate, ordering and performing treatments and interventions, ordering and review of laboratory studies, ordering and review of radiographic studies, pulse oximetry and re-evaluation of patient's condition.     Ward GivensIva L Zamora Colton, MD 01/06/15 [phone]  Ward GivensIva L Tashanna Dolin, MD 01/06/15 [phone]

## 2015-01-06 NOTE — Care Management Utilization Note (Signed)
UR completed 

## 2015-01-06 NOTE — Progress Notes (Signed)
Patient feed supper. Fair appetite. No coughing noted. Difficult to understand speech at time. Notified MD regarding lactic acid 2.5.

## 2015-01-06 NOTE — ED Notes (Signed)
Pt CBG 190. 

## 2015-01-06 NOTE — Evaluation (Signed)
89 YOF from SNF FTT, found hypoglycemic, unresponsive, hypothermic. Not eating at SNF Apparently on insulin despite not eating and FTT (body wt 38 kg)? Given glucagon, d5  CBG 30s-->280  Code status: no CPR, no intubation, no feeding tube, no ICU per EDP-most form filled out by family  + fluids, abx, BiPAP   Temp still 95 w/ bear hugger, BP 100s w/ IVFs Asked EDP to at least check CXR and lactate F/u on UA  Medsurg bed

## 2015-01-06 NOTE — Progress Notes (Signed)
INITIAL NUTRITION ASSESSMENT  DOCUMENTATION CODES Per approved criteria  -Severe malnutrition in the context of chronic illness   Pt meets criteria for severe  MALNUTRITION in the context of chronic as evidenced by weight loss >20% in 1 year, severe muscle wasting clavicles, acromion, patellar, anterior thigh and posterior calf regions.   INTERVENTION: Assist with feeding   Ensure Complete po BID, each supplement provides 350 kcal and 13 grams of protein   ProStat 30 ml BID (200  Kcal, 30 gr protein daily)  NUTRITION DIAGNOSIS: Inadequate oral intake related to dementia disease progression?? as evidenced by  ongoing weight loss over time      Goal: Meet nutrition needs as able given pt wishes regarding nutrition care  Monitor:  Changes in status, meal and supplement intake and labs  Reason for Assessment: Malnutrition Screen   81 y.o. female  ASSESSMENT: Pt from SNF. Hx includes  dementia, diabetes, Hypertension, Hyperlipidemia, GERD, renal insufficiency. Chief complaint on admission hypoglycemia.  Unable to provide diet hx.  She has severe weight loss, fat mass depletion and muscle wasting. She needs assistance with meals and supplements.  RD last assessed pt on 10/102014. Her weight at that time was 135# which was significantly decreased from her usual weight of 142-146#.    Nutrition Focused Physical Exam:  Subcutaneous Fat:  Orbital Region: severe fat mass depletion Upper Arm Region: severe depletion Thoracic and Lumbar Region: severe depleition  Muscle:  Temple Region: moderate wasting Clavicle Bone Region: severe wasting Clavicle and Acromion Bone Region: severe wasting Scapular Bone Region: moderate wasting Dorsal Hand: moderate wasting Patellar Region: severe wasting Anterior Thigh Region: severe wasting Posterior Calf Region: severe wasting  Edema: none noted    Height: Ht Readings from Last 1 Encounters:  06/21/14 5\' 5"  (1.651 m)    Weight: Wt  Readings from Last 1 Encounters:  01/06/15 83 lb (37.649 kg)    Ideal Body Weight: 125#  % Ideal Body Weight: 66%  Wt Readings from Last 10 Encounters:  01/06/15 83 lb (37.649 kg)  09/24/13 135 lb (61.236 kg)  09/15/13 144 lb (65.318 kg)  01/13/13 144 lb (65.318 kg)  08/31/12 144 lb (65.318 kg)  05/23/12 144 lb 1.9 oz (65.372 kg)  12/09/11 148 lb 3.2 oz (67.223 kg)  11/28/11 142 lb 6.4 oz (64.592 kg)  08/27/11 144 lb 12.8 oz (65.681 kg)  08/18/11 124 lb (56.246 kg)    Usual Body Weight: 135#  % Usual Body Weight: 61%  BMI:  Body mass index is 13.81 kg/(m^2). underweight  Estimated Nutritional Needs: Kcal: 1200-1400 Protein: 55-65 gr Fluid: 1200 ml daily  Skin: no breakdown noted  Diet Order: Diet Carb Modified  EDUCATION NEEDS: -No education needs identified at this time  No intake or output data in the 24 hours ending 01/06/15 1139  Last BM: 1/21 loose ; mucous stool  Labs:   Recent Labs Lab 01/06/15 0430  NA 126*  K 4.2  CL 96  CO2 24  BUN 19  CREATININE 0.87  CALCIUM 7.5*  GLUCOSE 325*    CBG (last 3)   Recent Labs  01/06/15 0425 01/06/15 0615 01/06/15 0824  GLUCAP 190* 281* 216*    Scheduled Meds: . acidophilus  1 capsule Oral Daily  . allopurinol  100 mg Oral Daily  . amLODipine  10 mg Oral Daily  . aspirin EC  81 mg Oral Daily  . benazepril  10 mg Oral Daily  . feeding supplement (ENSURE COMPLETE)  237 mL Oral  BID  . feeding supplement (PRO-STAT SUGAR FREE 64)  30 mL Oral BID  . haloperidol  1 mg Oral BID  . heparin  5,000 Units Subcutaneous 3 times per day  . insulin aspart  0-15 Units Subcutaneous 6 times per day  . levothyroxine  50 mcg Oral QAC breakfast  . memantine  5 mg Oral BID  . metFORMIN  500 mg Oral BID WC  . metoprolol tartrate  25 mg Oral BID  . sodium chloride  3 mL Intravenous Q12H  . vitamin B-12  2,000 mcg Oral Daily    Continuous Infusions: . sodium chloride    . dextrose 5 % and 0.9% NaCl Stopped  (01/06/15 0530)    Past Medical History  Diagnosis Date  . Diabetes mellitus     insulin dependant  . Hypertension   . Hyperlipidemia   . Paroxysmal atrial fibrillation   . GERD (gastroesophageal reflux disease)   . Dementia   . Cardiac pacemaker 2003, revised 2012    implant  . Hypothyroidism   . Renal insufficiency   . Asthma   . Gastroparesis 06/2011    abnormal GES, 80% retention at two hours  . Coronary artery disease   . Dementia   . Myocardial infarction     Past Surgical History  Procedure Laterality Date  . Pacemaker insertion    . Appendectomy    . Hernia repair  2007    incarcerated  . Hip fracture surgery  2003    left  . Esophagogastroduodenoscopy  07/09/2011    Procedure: ESOPHAGOGASTRODUODENOSCOPY (EGD);  Surgeon: Arlyce Harman, MD;  Location: [facility];  Service: Endoscopy;  Laterality: N/A;.  Mild gastritis, schatzki ring, small hh    Royann Shivers MS,RD,CSG,LDN Office: [phone] Pager: [phone]

## 2015-01-06 NOTE — ED Notes (Signed)
CBG- 281 

## 2015-01-06 NOTE — ED Notes (Signed)
Pt. Alert and oriented to self. Pt. Able to follow commands.

## 2015-01-06 NOTE — H&P (Signed)
[facility] History and Physical  [name] AVW:[id number] DOB: [birth year] DOA: 01/06/2015  Referring physician: Emergency Department PCP: Milana Obey, MD  Specialists:   Chief Complaint: Failure to thrive, hypothermia  HPI: [name] is a 81 y.o. female  With a hx of dementia, DM, htn, HLD who presented to the ED with worsening failure to thrive, hypothermia, and hypoglycemia with blood glucose reportedly in the 30's. The patient was given glucagon and D5 with improvement in glucose. The patient was started on a warming blanket and hospitalist service was consulted for consideration for admission. Of note, the patient has a MOST form, allowing the use of IVF, abx, and BiPAP only, and NO CPR, intubation, feeding tubes or ICU level admit.  Review of Systems:   Per above, the remainder of the 10pt ros reviewed and are neg  Past Medical History  Diagnosis Date  . Diabetes mellitus     insulin dependant  . Hypertension   . Hyperlipidemia   . Paroxysmal atrial fibrillation   . GERD (gastroesophageal reflux disease)   . Dementia   . Cardiac pacemaker 2003, revised 2012    implant  . Hypothyroidism   . Renal insufficiency   . Asthma   . Gastroparesis 06/2011    abnormal GES, 80% retention at two hours  . Coronary artery disease   . Dementia   . Myocardial infarction    Past Surgical History  Procedure Laterality Date  . Pacemaker insertion    . Appendectomy    . Hernia repair  2007    incarcerated  . Hip fracture surgery  2003    left  . Esophagogastroduodenoscopy  07/09/2011    Procedure: ESOPHAGOGASTRODUODENOSCOPY (EGD);  Surgeon: Arlyce Harman, MD;  Location: [facility];  Service: Endoscopy;  Laterality: N/A;.  Mild gastritis, schatzki ring, small hh   Social History:  reports that she has quit smoking. She has never used smokeless tobacco. She reports that she does not drink alcohol or use illicit drugs.  where does patient live--home, ALF,  SNF? and with whom if at home?  Can patient participate in ADLs?  No Known Allergies  Family History  Problem Relation Age of Onset  . Diabetes Father   . Colon cancer Neg Hx     (be sure to complete)  Prior to Admission medications   Medication Sig Start Date End Date Taking? Authorizing Provider  acetaminophen (TYLENOL) 500 MG tablet Take 500-1,000 mg by mouth every 4 (four) hours as needed. For pain/fever     Historical Provider, MD  allopurinol (ZYLOPRIM) 100 MG tablet Take 100 mg by mouth daily.     Historical Provider, MD  amLODipine (NORVASC) 10 MG tablet Take 10 mg by mouth daily.     Historical Provider, MD  aspirin EC 81 MG tablet Take 81 mg by mouth daily.      Historical Provider, MD  B Complex-C (B-COMPLEX WITH VITAMIN C) tablet Take 1 tablet by mouth daily.      Historical Provider, MD  benazepril (LOTENSIN) 10 MG tablet Take 10 mg by mouth daily.    Historical Provider, MD  donepezil (ARICEPT) 10 MG tablet Take 10 mg by mouth at bedtime.    Historical Provider, MD  doxycycline (VIBRA-TABS) 100 MG tablet 2 (two) times daily. 06/14/14   Historical Provider, MD  ergocalciferol (VITAMIN D2) 50000 UNITS capsule Take 50,000 Units by mouth every Thursday. Take one capsule on Thursdays    Historical Provider, MD  escitalopram (LEXAPRO)  5 MG tablet daily. 06/17/14   Historical Provider, MD  haloperidol (HALDOL) 1 MG tablet Take 1 tablet (1 mg total) by mouth 2 (two) times daily. 09/28/13   Nimish Normajean Glasgow, MD  insulin aspart (NOVOLOG) 100 UNIT/ML injection Inject 5 Units into the skin 2 (two) times daily. Gets at 0730 before breakfast and at 1530 before supper.    Historical Provider, MD  levothyroxine (SYNTHROID, LEVOTHROID) 50 MCG tablet Take 50 mcg by mouth daily.     Historical Provider, MD  memantine (NAMENDA) 5 MG tablet Take 5 mg by mouth 2 (two) times daily.    Historical Provider, MD  metoprolol tartrate (LOPRESSOR) 25 MG tablet Take 25 mg by mouth 2 (two) times daily.     Historical Provider, MD  naphazoline-pheniramine (NAPHCON-A) 0.025-0.3 % ophthalmic solution Place 1 drop into both eyes 3 (three) times daily as needed. For itching    Historical Provider, MD  omeprazole (PRILOSEC) 20 MG capsule Take 20 mg by mouth daily before breakfast.   06/27/11   Tiffany Kocher, PA-C  ondansetron (ZOFRAN) 8 MG tablet Take 8 mg by mouth every 6 (six) hours as needed. For nausea    Historical Provider, MD  Polyethylene Glycol 3350 GRAN Take 17 g by mouth every Monday, Wednesday, and Friday. Patient takes on Mon.,Wed.,Fri. 08/27/11   Tiffany Kocher, PA-C  pravastatin (PRAVACHOL) 20 MG tablet Take 20 mg by mouth every evening.    Historical Provider, MD  vitamin B-12 (CYANOCOBALAMIN) 1000 MCG tablet Take 2,000 mcg by mouth daily.      Historical Provider, MD   Physical Exam: Filed Vitals:   01/06/15 0840 01/06/15 1456 01/06/15 2256 01/07/15 0633  BP: 121/57 109/60 99/73 122/65  Pulse: 74 74 69 56  Temp: 98.5 F (36.9 C) 98.6 F (37 C) 98.5 F (36.9 C) 99.6 F (37.6 C)  TempSrc: Oral  Oral Oral  Resp: Weight: 39.2 kg (86 lb 6.7 oz)   41 kg (90 lb 6.2 oz)  SpO2: 96% 97% 98% 96%     General:  Awake, in nad  Eyes: PERRL B  ENT: membranes moist, dentition fair  Neck: trachea midline, neck supple  Cardiovascular: regular, s1, s2  Respiratory: normal resp effort, no wheezing  Abdomen: soft,nondistended  Skin: normal skin turgor, no abnormal skin lesions seen  Musculoskeletal: perfused, no clubbing  Psychiatric: mood/affect normal//no auditory/visual hallucinations  Neurologic: cn2-12 grossly intact, strength/sensation intact  Labs on Admission:  Basic Metabolic Panel:  Recent Labs Lab 01/06/15 0430 01/06/15 1524 01/07/15 0548  NA 126* 129* 131*  K 4.2  --  4.3  CL 96  --  100  CO2 24  --  25  GLUCOSE 325*  --  99  BUN 19  --  20  CREATININE 0.87  --  0.81  CALCIUM 7.5*  --  8.1*   Liver Function Tests:  Recent Labs Lab  01/06/15 0430 01/07/15 0548  AST 56* 42*  ALT 17 24  ALKPHOS 76 143*  BILITOT 0.4 0.5  PROT 5.2* 5.6*  ALBUMIN 2.4* 2.7*   No results for input(s): LIPASE, AMYLASE in the last 168 hours. No results for input(s): AMMONIA in the last 168 hours. CBC:  Recent Labs Lab 01/06/15 0430 01/07/15 0548  WBC 4.8 3.7*  NEUTROABS 3.5  --   HGB 10.2* 9.4*  HCT 31.1* 27.3*  MCV 92.0 91.6  PLT 208 258   Cardiac Enzymes: No results for input(s): CKTOTAL, CKMB, CKMBINDEX,  TROPONINI in the last 168 hours.  BNP (last 3 results) No results for input(s): PROBNP in the last 8760 hours. CBG:  Recent Labs Lab 01/06/15 1133 01/06/15 1706 01/06/15 2021 01/06/15 2346 01/07/15 0449  GLUCAP 157* 196* 103* 134* 98    Radiological Exams on Admission: Dg Chest Port 1 View  01/06/2015   CLINICAL DATA:  Hypoglycemia, decreased body temperature  EXAM: PORTABLE CHEST - 1 VIEW  COMPARISON:  09/25/2013  FINDINGS: Cardiac shadow is within normal limits. A pacing device is again seen. The lungs are well aerated bilaterally without focal infiltrate or sizable effusion. No definitive acute bony abnormality is seen.  IMPRESSION: No acute abnormality noted.   Electronically Signed   By: Alcide CleverMark  Lukens M.D.   On: 01/06/2015 07:04    Assessment/Plan Active Problems:   Atrial fibrillation   Dementia   Protein-calorie malnutrition, severe   Hypothermia   Hypoglycemia due to insulin   1. Hypoglycemia 1. Continue only on SSI coverage for now 2. Encourage PO intake when able to 2. Hypothermia 1. Continue with warming blanket 2. Suspect secondary to above 3. UA with pos leuks but only 0-2 WBC 4. No leukocytosis 5. Hold off on empiric abx 6. Blood cx pending 3. Failure to thrive with severe protein calorie malnutrition 1. Encourage PO when able to eat 4. Afib 1. Rate controlled 2. Cont home meds for now 5. Dementia 1. Seems stable 6. DVT prophylaxis 1. Heparin subQ 7. Hyponatremia 1. Suspect  secondary to dehydration 2. Will check serum/urine osmol, urine sodium 3. Cont on NS for now  Code Status: DNR (must indicate code status--if unknown or must be presumed, indicate so) Family Communication: Pt in room (indicate person spoken with, if applicable, with phone number if by telephone) Disposition Plan: Admit to floor (indicate anticipated LOS)  Time spent: 25min  CHIU, Scheryl MartenSTEPHEN K [facility] Pager [phone]  If 7PM-7AM, please contact night-coverage [email or web address] Password [email or web address] 01/07/2015, 7:38 AM

## 2015-01-06 NOTE — Clinical Social Work Psychosocial (Signed)
Clinical Social Work Department BRIEF PSYCHOSOCIAL ASSESSMENT 01/06/2015  Patient:  [name],[name]     Account Number:  [id number]     Admit date:  01/06/2015  Clinical Social Worker:  Nancie NeasSTULTZ,Hao Dion, LCSW  Date/Time:  01/06/2015 11:32 AM  Referred by:  CSW  Date Referred:  01/06/2015 Referred for  SNF Placement   Other Referral:   Interview type:  Family Other interview type:   brother-in-law: Junius    PSYCHOSOCIAL DATA Living Status:  FACILITY Admitted from facility[facility]  [facility]  Level of care:  Skilled Nursing Facility Primary support name:  Junius/[place] Primary support relationship to patient:  SIBLING Degree of support available:   supportive    CURRENT CONCERNS Current Concerns  Post-Acute Placement   Other Concerns:    SOCIAL WORK ASSESSMENT / PLAN CSW attempted to meet with pt at bedside. Pt had covers pulled over head. RN in room reports pt is oriented to self only. CSW spoke with pt's brother-in-law, Junius on phone. He is aware pt has been admitted this morning. Junius states that his wife is pt's sister and they handle everything for pt. She has been a resident at [facility] for about 2 years. Family visit pt regularly. Pt's sister recently had back surgery and has not been able to visit pt, but Junius is still going regularly. He feels things are going well at [facility] and requests return there. Per Eunice Blaseebbie at facility, pt is nursing level of care and okay to return. She is an extensive assist and wheelchair bound.   Assessment/plan status:  Psychosocial Support/Ongoing Assessment of Needs Other assessment/ plan:   Information/referral to community resources:   [facility]    PATIENT'S/FAMILY'S RESPONSE TO PLAN OF CARE: Pt unable to participate in assessment. Family report positive feelings regarding return to [facility]. CSW will continue to follow.       Derenda FennelKara Amaal Dimartino, KentuckyLCSW [phone]

## 2015-01-06 NOTE — ED Notes (Signed)
Junior Tree surgeonBlackwell (caregiver) can be contacted at  [phone] or [phone]

## 2015-01-07 LAB — GLUCOSE, CAPILLARY
GLUCOSE-CAPILLARY: 118 mg/dL — AB (ref 70–99)
GLUCOSE-CAPILLARY: 146 mg/dL — AB (ref 70–99)
GLUCOSE-CAPILLARY: 109 mg/dL — AB (ref 70–99)
Glucose-Capillary: 98 mg/dL (ref 70–99)
Glucose-Capillary: 89 mg/dL (ref 70–99)

## 2015-01-07 LAB — COMPREHENSIVE METABOLIC PANEL
ALBUMIN: 2.7 g/dL — AB (ref 3.5–5.2)
ALK PHOS: 143 U/L — AB (ref 39–117)
ALT: 24 U/L (ref 0–35)
AST: 42 U/L — ABNORMAL HIGH (ref 0–37)
Anion gap: 6 (ref 5–15)
BUN: 20 mg/dL (ref 6–23)
CO2: 25 mmol/L (ref 19–32)
CREATININE: 0.81 mg/dL (ref 0.50–1.10)
Calcium: 8.1 mg/dL — ABNORMAL LOW (ref 8.4–10.5)
Chloride: 100 mEq/L (ref 96–112)
GFR calc Af Amer: 74 mL/min — ABNORMAL LOW (ref >90)
GFR calc non Af Amer: 63 mL/min — ABNORMAL LOW (ref >90)
Glucose, Bld: 99 mg/dL (ref 70–99)
POTASSIUM: 4.3 mmol/L (ref 3.5–5.1)
Sodium: 131 mmol/L — ABNORMAL LOW (ref 135–145)
Total Bilirubin: 0.5 mg/dL (ref 0.3–1.2)
Total Protein: 5.6 g/dL — ABNORMAL LOW (ref 6.0–8.3)

## 2015-01-07 LAB — CBC
HCT: 27.3 % — ABNORMAL LOW (ref 36.0–46.0)
Hemoglobin: 9.4 g/dL — ABNORMAL LOW (ref 12.0–15.0)
MCH: 31.5 pg (ref 26.0–34.0)
MCHC: 34.4 g/dL (ref 30.0–36.0)
MCV: 91.6 fL (ref 78.0–100.0)
Platelets: 258 10*3/uL (ref 150–400)
RBC: 2.98 MIL/uL — ABNORMAL LOW (ref 3.87–5.11)
RDW: 14.6 % (ref 11.5–15.5)
WBC: 3.7 10*3/uL — ABNORMAL LOW (ref 4.0–10.5)

## 2015-01-07 NOTE — Progress Notes (Signed)
[facility] PROGRESS NOTE  [name] ZHY:[id number] DOB: 11/14/1925 DOA: 01/06/2015 PCP: Milana ObeyKNOWLTON,STEPHEN D, MD  Assessment/Plan:  1. Hypoglycemia/DM2 1. Patient is continued on SSI alone 2. Continue to encourage PO intake 3. Hold metformin secondary to elevated lactic acid on admit 2. Hypothermia 1. Resolved 2. Was improved with warming blanket 3. Suspect secondary to above 4. Holding off on empiric abx 3. Failure to thrive with severe protein calorie malnutrition 1. Encourage PO as tolerated 4. Afib 1. Rate controlled 2. Cont home meds for now 5. Dementia 1. Seems stable 6. DVT prophylaxis 1. Heparin subQ 7. Hyponatremia 1. Suspect secondary to dehydration 2. Improving with IVF 8. Elevated lactic acid 1. Improved with IVF  Code Status: DNR Family Communication: Pt in room Disposition Plan: Pending   Consultants:    Procedures:    Antibiotics:  none (indicate start date, and stop date if known)  HPI/Subjective: No acute events noted overnight.  Objective: Filed Vitals:   01/06/15 0840 01/06/15 1456 01/06/15 2256 01/07/15 0633  BP: 121/57 109/60 99/73 122/65  Pulse: 74 74 69 56  Temp: 98.5 F (36.9 C) 98.6 F (37 C) 98.5 F (36.9 C) 99.6 F (37.6 C)  TempSrc: Oral  Oral Oral  Resp: 16 14 20 20   Weight: 39.2 kg (86 lb 6.7 oz)   41 kg (90 lb 6.2 oz)  SpO2: 96% 97% 98% 96%    Intake/Output Summary (Last 24 hours) at 01/07/15 1406 Last data filed at 01/07/15 1252  Gross per 24 hour  Intake   1080 ml  Output      0 ml  Net   1080 ml   Filed Weights   01/06/15 0826 01/06/15 0840 01/07/15 0633  Weight: 37.649 kg (83 lb) 39.2 kg (86 lb 6.7 oz) 41 kg (90 lb 6.2 oz)    Exam:   General:  Awake, in nad, confused  Cardiovascular: regular, s1, s2  Respiratory: normal resp effort, no wheezing  Abdomen: soft,nondistended  Musculoskeletal: perfused, no clubbing   Data Reviewed: Basic Metabolic Panel:  Recent Labs Lab  01/06/15 0430 01/06/15 1524 01/07/15 0548  NA 126* 129* 131*  K 4.2  --  4.3  CL 96  --  100  CO2 24  --  25  GLUCOSE 325*  --  99  BUN 19  --  20  CREATININE 0.87  --  0.81  CALCIUM 7.5*  --  8.1*   Liver Function Tests:  Recent Labs Lab 01/06/15 0430 01/07/15 0548  AST 56* 42*  ALT 17 24  ALKPHOS 76 143*  BILITOT 0.4 0.5  PROT 5.2* 5.6*  ALBUMIN 2.4* 2.7*   No results for input(s): LIPASE, AMYLASE in the last 168 hours. No results for input(s): AMMONIA in the last 168 hours. CBC:  Recent Labs Lab 01/06/15 0430 01/07/15 0548  WBC 4.8 3.7*  NEUTROABS 3.5  --   HGB 10.2* 9.4*  HCT 31.1* 27.3*  MCV 92.0 91.6  PLT 208 258   Cardiac Enzymes: No results for input(s): CKTOTAL, CKMB, CKMBINDEX, TROPONINI in the last 168 hours. BNP (last 3 results) No results for input(s): PROBNP in the last 8760 hours. CBG:  Recent Labs Lab 01/06/15 2021 01/06/15 2346 01/07/15 0449 01/07/15 0814 01/07/15 1231  GLUCAP 103* 134* 98 89 118*    Recent Results (from the past 240 hour(s))  Blood culture (routine x 2)     Status: None (Preliminary result)   Collection Time: 01/06/15  7:09 AM  Result Value Ref  Range Status   Specimen Description BLOOD LEFT HAND  Final   Special Requests BOTTLES DRAWN AEROBIC ONLY 6CC  Final   Culture NO GROWTH 1 DAY  Final   Report Status PENDING  Incomplete  Blood culture (routine x 2)     Status: None (Preliminary result)   Collection Time: 01/06/15  7:09 AM  Result Value Ref Range Status   Specimen Description BLOOD RIGHT HAND  Final   Special Requests BOTTLES DRAWN AEROBIC ONLY 6CC  Final   Culture NO GROWTH 1 DAY  Final   Report Status PENDING  Incomplete     Studies: Dg Chest Port 1 View  01/06/2015   CLINICAL DATA:  Hypoglycemia, decreased body temperature  EXAM: PORTABLE CHEST - 1 VIEW  COMPARISON:  09/25/2013  FINDINGS: Cardiac shadow is within normal limits. A pacing device is again seen. The lungs are well aerated bilaterally  without focal infiltrate or sizable effusion. No definitive acute bony abnormality is seen.  IMPRESSION: No acute abnormality noted.   Electronically Signed   By: Alcide Clever M.D.   On: 01/06/2015 07:04    Scheduled Meds: . acidophilus  1 capsule Oral Daily  . allopurinol  100 mg Oral Daily  . amLODipine  10 mg Oral Daily  . aspirin EC  81 mg Oral Daily  . benazepril  10 mg Oral Daily  . feeding supplement (ENSURE COMPLETE)  237 mL Oral BID  . feeding supplement (PRO-STAT SUGAR FREE 64)  30 mL Oral BID  . haloperidol  1 mg Oral BID  . heparin  5,000 Units Subcutaneous 3 times per day  . insulin aspart  0-15 Units Subcutaneous 6 times per day  . levothyroxine  50 mcg Oral QAC breakfast  . memantine  5 mg Oral BID  . metoprolol tartrate  25 mg Oral BID  . sodium chloride  3 mL Intravenous Q12H  . vitamin B-12  2,000 mcg Oral Daily   Continuous Infusions: . sodium chloride 75 mL/hr at 01/07/15 1015  . dextrose 5 % and 0.9% NaCl Stopped (01/06/15 0530)    Active Problems:   Atrial fibrillation   Dementia   Protein-calorie malnutrition, severe   Hypothermia   Hypoglycemia due to insulin  Time spent:  CHIU, STEPHEN K  [facility] Pager [phone]. If 7PM-7AM, please contact night-coverage at [email or web address], password [facility] 01/07/2015, 2:06 PM  LOS: 1 day

## 2015-01-07 NOTE — Clinical Social Work Note (Signed)
CSW updated [facility] on pt. Facility can accept when stable.  Derenda FennelKara Lizvette Lightsey, Kentucky[place]CSW [phone]

## 2015-01-07 NOTE — Care Management Note (Signed)
    Page 1 of 1   01/07/2015     9:36:10 AM CARE MANAGEMENT NOTE 01/07/2015  Patient:  [name],[name]   Account Number:  [id number]  Date Initiated:  01/07/2015  Documentation initiated by:  Sharrie RothmanBLACKWELL,Alexzia Kasler C  Subjective/Objective Assessment:   Pt admitted from [facility] with hypothermia. Anticipate discharge back to facility when medically stable.     Action/Plan:   CSW to arrange discharge to facility at discharge.   Anticipated DC Date:  01/10/2015   Anticipated DC Plan:  SKILLED NURSING FACILITY  In-house referral  Clinical Social Worker      DC Planning Services  CM consult      Choice offered to / List presented to:             Status of service:  Completed, signed off Medicare Important Message given?  YES (If response is "NO", the following Medicare IM given date fields will be blank) Date Medicare IM given:  01/07/2015 Medicare IM given by:  Anibal HendersonBOLDEN,GENEVA Date Additional Medicare IM given:   Additional Medicare IM given by:    Discharge Disposition:  SKILLED NURSING FACILITY  Per UR Regulation:    If discussed at Long Length of Stay Meetings, dates discussed:    Comments:  01/07/15 0935 Arlyss Queenammy Adeana Grilliot, RN BSN CM

## 2015-01-08 DIAGNOSIS — I480 Paroxysmal atrial fibrillation: Secondary | ICD-10-CM

## 2015-01-08 LAB — BASIC METABOLIC PANEL
Anion gap: 5 (ref 5–15)
BUN: 24 mg/dL — ABNORMAL HIGH (ref 6–23)
CO2: 23 mmol/L (ref 19–32)
Calcium: 8.1 mg/dL — ABNORMAL LOW (ref 8.4–10.5)
Chloride: 104 mmol/L (ref 96–112)
Creatinine, Ser: 0.75 mg/dL (ref 0.50–1.10)
GFR calc Af Amer: 86 mL/min — ABNORMAL LOW (ref >90)
GFR calc non Af Amer: 74 mL/min — ABNORMAL LOW (ref >90)
Glucose, Bld: 105 mg/dL — ABNORMAL HIGH (ref 70–99)
POTASSIUM: 4.7 mmol/L (ref 3.5–5.1)
Sodium: 132 mmol/L — ABNORMAL LOW (ref 135–145)

## 2015-01-08 LAB — GLUCOSE, CAPILLARY
GLUCOSE-CAPILLARY: 87 mg/dL (ref 70–99)
GLUCOSE-CAPILLARY: 88 mg/dL (ref 70–99)
Glucose-Capillary: 81 mg/dL (ref 70–99)
Glucose-Capillary: 150 mg/dL — ABNORMAL HIGH (ref 70–99)

## 2015-01-08 LAB — LACTIC ACID, PLASMA: LACTIC ACID, VENOUS: 1.1 mmol/L (ref 0.5–2.0)

## 2015-01-08 MED ORDER — BISMUTH SUBSALICYLATE 262 MG/15ML PO SUSP
30.0000 mL | ORAL | Status: DC | PRN
  Filled 2015-01-08: qty 236

## 2015-01-08 NOTE — Progress Notes (Signed)
Discharge packet given to EMS, d/c to [facility], [facility]

## 2015-01-08 NOTE — Discharge Summary (Addendum)
Physician Discharge Summary  [name]:[id number] DOB: 13-Oct-1925 DOA: 01/06/2015  PCP: Milana Obey, MD  Admit date: 01/06/2015 Discharge date: 01/08/2015  Time spent: 25 minutes  Recommendations for Outpatient Follow-up:  1. Follow up with PCP in 1-2 weeks 2. Please note, patient's insulin will be d/c'd on discharge, but she should continue metformin 3. Please encourage patient to eat 4. Would defer further diabetic med changes to PCP  Discharge Diagnoses:  Active Problems:   Atrial fibrillation   Dementia   Protein-calorie malnutrition, severe   Hypothermia   Hypoglycemia due to insulin   Discharge Condition: Improved  Diet recommendation: Dysphagia 1 with thin liquids  Filed Weights   01/06/15 0826 01/06/15 0840 01/07/15 0633  Weight: 37.649 kg (83 lb) 39.2 kg (86 lb 6.7 oz) 41 kg (90 lb 6.2 oz)    History of present illness:  Please review h and p from 1/21 for details. Briefly, pt presents from facility with profound hypoglycemia and hypothermia with failure to thrive. The patient was admitted for further work up.  Hospital Course:  1. Hypoglycemia/DM2 1. While in the hospital, the patient was continued on SSI alone 2. Continue to encourage PO intake 3. Held metformin secondary to elevated lactic acid on admit 4. Lactate resolved by day of discharge, thus patient is to continue with metformin alone on discharge 2. Hypothermia 1. Resolved 2. Was improved with warming blanket 3. Suspect secondary to above 4. Held off on empiric abx as there was no obvious sign of infection 3. Failure to thrive with severe protein calorie malnutrition 1. Would continue to encourage PO as tolerated 4. Afib 1. Remained rate controlled 2. Cont home meds for now 5. Dementia 1. Remained stable 6. DVT prophylaxis 1. Heparin subQ while inpatient 7. Hyponatremia 1. Suspect secondary to dehydration 2. Improving with IVF 3. Continue to encourage PO  intake/hydration 8. Elevated lactic acid 1. Resolved with IVF  Consultations:  none  Discharge Exam: Filed Vitals:   01/07/15 0633 01/07/15 1506 01/07/15 2104 01/08/15 0427  BP: 122/65 130/54 119/62 111/62  Pulse: 56 84 68 75  Temp: 99.6 F (37.6 C) 97.5 F (36.4 C) 98.4 F (36.9 C) 97.8 F (36.6 C)  TempSrc: Oral Oral Oral Oral  Resp: Height:      Weight: 41 kg (90 lb 6.2 oz)     SpO2: 96% 96% 97% 92%    General: Awake, in nad, confused Cardiovascular: regular, s1, s2 Respiratory: normal resp effort, no wheezing  Discharge Instructions     Medication List    STOP taking these medications        HUMALOG KWIKPEN 100 UNIT/ML KiwkPen  Generic drug:  insulin lispro      TAKE these medications        acetaminophen 500 MG tablet  Commonly known as:  TYLENOL  Take 500 mg by mouth every 4 (four) hours as needed for mild pain or fever. For pain/fever     acidophilus Caps capsule  Take 1 capsule by mouth daily.     allopurinol 100 MG tablet  Commonly known as:  ZYLOPRIM  Take 100 mg by mouth daily.     amLODipine 10 MG tablet  Commonly known as:  NORVASC  Take 10 mg by mouth daily.     aspirin EC 81 MG tablet  Take 81 mg by mouth daily.     B-complex with vitamin C tablet  Take 1 tablet by mouth daily.  benazepril 10 MG tablet  Commonly known as:  LOTENSIN  Take 10 mg by mouth daily.     ergocalciferol 50000 UNITS capsule  Commonly known as:  VITAMIN D2  Take 50,000 Units by mouth every 30 (thirty) days. Take one capsule on Thursdays     escitalopram 10 MG tablet  Commonly known as:  LEXAPRO  Take 10 mg by mouth daily.     feeding supplement (PRO-STAT SUGAR FREE 64) Liqd  Take 30 mLs by mouth 2 (two) times daily.     haloperidol 1 MG tablet  Commonly known as:  HALDOL  Take 1 tablet (1 mg total) by mouth 2 (two) times daily.     lactose free nutrition Liqd  Take 237 mLs by mouth 2 (two) times daily.     levothyroxine 50 MCG  tablet  Commonly known as:  SYNTHROID, LEVOTHROID  Take 50 mcg by mouth daily.     memantine 5 MG tablet  Commonly known as:  NAMENDA  Take 5 mg by mouth 2 (two) times daily.     metFORMIN 500 MG tablet  Commonly known as:  GLUCOPHAGE  Take 500 mg by mouth 2 (two) times daily with a meal.     metoprolol tartrate 25 MG tablet  Commonly known as:  LOPRESSOR  Take 25 mg by mouth 2 (two) times daily.     naphazoline-pheniramine 0.025-0.3 % ophthalmic solution  Commonly known as:  NAPHCON-A  Place 1 drop into both eyes every 8 (eight) hours as needed (itching).     ondansetron 8 MG tablet  Commonly known as:  ZOFRAN  Take 8 mg by mouth every 6 (six) hours as needed for nausea.     ranitidine 150 MG capsule  Commonly known as:  ZANTAC  Take 150 mg by mouth 2 (two) times daily.     vitamin B-12 1000 MCG tablet  Commonly known as:  CYANOCOBALAMIN  Take 2,000 mcg by mouth daily.       No Known Allergies Follow-up Information    Follow up with Milana ObeyKNOWLTON,Raghav Verrilli D, MD. Schedule an appointment as soon as possible for a visit in 1 week.   Specialty:  Family Medicine   Contact information:   [address] [place] [address] [phone]        The results of significant diagnostics from this hospitalization (including imaging, microbiology, ancillary and laboratory) are listed below for reference.    Significant Diagnostic Studies: Dg Chest Port 1 View  01/06/2015   CLINICAL DATA:  Hypoglycemia, decreased body temperature  EXAM: PORTABLE CHEST - 1 VIEW  COMPARISON:  09/25/2013  FINDINGS: Cardiac shadow is within normal limits. A pacing device is again seen. The lungs are well aerated bilaterally without focal infiltrate or sizable effusion. No definitive acute bony abnormality is seen.  IMPRESSION: No acute abnormality noted.   Electronically Signed   By: Alcide CleverMark  Lukens M.D.   On: 01/06/2015 07:04    Microbiology: Recent Results (from the past 240 hour(s))  Blood  culture (routine x 2)     Status: None (Preliminary result)   Collection Time: 01/06/15  7:09 AM  Result Value Ref Range Status   Specimen Description BLOOD LEFT HAND  Final   Special Requests BOTTLES DRAWN AEROBIC ONLY 6CC  Final   Culture NO GROWTH 2 DAYS  Final   Report Status PENDING  Incomplete  Blood culture (routine x 2)     Status: None (Preliminary result)   Collection Time: 01/06/15  7:09  AM  Result Value Ref Range Status   Specimen Description BLOOD RIGHT HAND  Final   Special Requests BOTTLES DRAWN AEROBIC ONLY 6CC  Final   Culture NO GROWTH 2 DAYS  Final   Report Status PENDING  Incomplete     Labs: Basic Metabolic Panel:  Recent Labs Lab 01/06/15 0430 01/06/15 1524 01/07/15 0548 01/08/15 0500  NA 126* 129* 131* 132*  K 4.2  --  4.3 4.7  CL 96  --  100 104  CO2 24  --  25 23  GLUCOSE 325*  --  99 105*  BUN 19  --  20 24*  CREATININE 0.87  --  0.81 0.75  CALCIUM 7.5*  --  8.1* 8.1*   Liver Function Tests:  Recent Labs Lab 01/06/15 0430 01/07/15 0548  AST 56* 42*  ALT 17 24  ALKPHOS 76 143*  BILITOT 0.4 0.5  PROT 5.2* 5.6*  ALBUMIN 2.4* 2.7*   No results for input(s): LIPASE, AMYLASE in the last 168 hours. No results for input(s): AMMONIA in the last 168 hours. CBC:  Recent Labs Lab 01/06/15 0430 01/07/15 0548  WBC 4.8 3.7*  NEUTROABS 3.5  --   HGB 10.2* 9.4*  HCT 31.1* 27.3*  MCV 92.0 91.6  PLT 208 258   Cardiac Enzymes: No results for input(s): CKTOTAL, CKMB, CKMBINDEX, TROPONINI in the last 168 hours. BNP: BNP (last 3 results) No results for input(s): PROBNP in the last 8760 hours. CBG:  Recent Labs Lab 01/07/15 1631 01/07/15 2101 01/08/15 0025 01/08/15 0424 01/08/15 0746  GLUCAP 146* 109* 87 88 81   Signed:  Tyniya Kuyper K  [facility] 01/08/2015, 11:43 AM

## 2015-01-09 LAB — HEMOGLOBIN A1C
HEMOGLOBIN A1C: 7 % — AB (ref <5.7)
MEAN PLASMA GLUCOSE: 154 mg/dL — AB (ref <117)

## 2015-01-11 LAB — CULTURE, BLOOD (ROUTINE X 2)
CULTURE: NO GROWTH
Culture: NO GROWTH

## 2015-01-20 ENCOUNTER — Encounter: Payer: Self-pay | Admitting: *Deleted

## 2015-02-10 ENCOUNTER — Encounter: Payer: Self-pay | Admitting: *Deleted

## 2015-02-24 ENCOUNTER — Telehealth: Payer: Self-pay | Admitting: Internal Medicine

## 2015-02-24 NOTE — Telephone Encounter (Signed)
New Message  Nursing facility received letter about past due check. Please call back and discuss.

## 2015-02-25 NOTE — Telephone Encounter (Signed)
Nursing home agreed to appt on 4-15 at 2:00 PM.

## 2015-04-17 DEATH — deceased

## 2015-07-12 ENCOUNTER — Encounter: Payer: Self-pay | Admitting: *Deleted

## 2015-08-12 ENCOUNTER — Encounter: Payer: Self-pay | Admitting: *Deleted
